# Patient Record
Sex: Female | Born: 1990 | Race: Black or African American | Hispanic: No | Marital: Single | Smoking: Current some day smoker
Health system: Southern US, Community
[De-identification: ages and names within clinical notes are randomized; demographics above are authoritative.]

## PROBLEM LIST (undated history)

## (undated) ENCOUNTER — Inpatient Hospital Stay (HOSPITAL_COMMUNITY): Payer: Self-pay

## (undated) DIAGNOSIS — B999 Unspecified infectious disease: Secondary | ICD-10-CM

## (undated) DIAGNOSIS — A4902 Methicillin resistant Staphylococcus aureus infection, unspecified site: Secondary | ICD-10-CM

## (undated) DIAGNOSIS — J45909 Unspecified asthma, uncomplicated: Secondary | ICD-10-CM

## (undated) DIAGNOSIS — R0600 Dyspnea, unspecified: Secondary | ICD-10-CM

## (undated) DIAGNOSIS — T4145XA Adverse effect of unspecified anesthetic, initial encounter: Secondary | ICD-10-CM

## (undated) DIAGNOSIS — O24419 Gestational diabetes mellitus in pregnancy, unspecified control: Secondary | ICD-10-CM

## (undated) DIAGNOSIS — D649 Anemia, unspecified: Secondary | ICD-10-CM

## (undated) DIAGNOSIS — T8859XA Other complications of anesthesia, initial encounter: Secondary | ICD-10-CM

## (undated) HISTORY — DX: Other complications of anesthesia, initial encounter: T88.59XA

## (undated) HISTORY — DX: Adverse effect of unspecified anesthetic, initial encounter: T41.45XA

---

## 2015-10-26 ENCOUNTER — Emergency Department (HOSPITAL_COMMUNITY)
Admission: EM | Admit: 2015-10-26 | Discharge: 2015-10-26 | Disposition: A | Discharge status: Home / Self Care | Payer: Self-pay | Attending: Emergency Medicine | Admitting: Emergency Medicine

## 2015-10-26 ENCOUNTER — Encounter (HOSPITAL_COMMUNITY): Payer: Self-pay | Admitting: Nurse Practitioner

## 2015-10-26 DIAGNOSIS — J45901 Unspecified asthma with (acute) exacerbation: Secondary | ICD-10-CM | POA: Insufficient documentation

## 2015-10-26 DIAGNOSIS — R062 Wheezing: Secondary | ICD-10-CM

## 2015-10-26 DIAGNOSIS — Z760 Encounter for issue of repeat prescription: Secondary | ICD-10-CM | POA: Insufficient documentation

## 2015-10-26 DIAGNOSIS — Z79899 Other long term (current) drug therapy: Secondary | ICD-10-CM | POA: Insufficient documentation

## 2015-10-26 DIAGNOSIS — R000 Tachycardia, unspecified: Secondary | ICD-10-CM | POA: Insufficient documentation

## 2015-10-26 HISTORY — DX: Unspecified asthma, uncomplicated: J45.909

## 2015-10-26 MED ORDER — IPRATROPIUM-ALBUTEROL 0.5-2.5 (3) MG/3ML IN SOLN
3.0000 mL | Freq: Once | RESPIRATORY_TRACT | Status: AC
  Administered 2015-10-26: 3 mL via RESPIRATORY_TRACT
  Filled 2015-10-26: qty 3

## 2015-10-26 MED ORDER — ALBUTEROL SULFATE (2.5 MG/3ML) 0.083% IN NEBU: 2.5000 mg | INHALATION_SOLUTION | Freq: Four times a day (QID) | RESPIRATORY_TRACT | Status: DC | PRN

## 2015-10-26 MED ORDER — ALBUTEROL SULFATE HFA 108 (90 BASE) MCG/ACT IN AERS: 1.0000 | INHALATION_SPRAY | Freq: Four times a day (QID) | RESPIRATORY_TRACT | Status: DC | PRN

## 2015-10-26 NOTE — ED Provider Notes (Signed)
CSN: [id number]     Arrival date & time 10/26/15  1246 History   First MD Initiated Contact with Patient 10/26/15 1327     Chief Complaint  Patient presents with  . Asthma     (Consider location/radiation/quality/duration/timing/severity/associated sxs/prior Treatment) Patient is a 25 y.o. female presenting with asthma. The history is provided by the patient. No language interpreter was used.  Asthma This is a new problem. The current episode started yesterday. The problem occurs intermittently.   Tina Grant is a.age female who present to the ED for refill of her asthma medications. She states that she is visiting and has been here for 2 weeks and ran out of her inhaler and neb medications 2 days ago. She has started having some wheezing and needs her medication. She denies any other problems.   Past Medical History  Diagnosis Date  . Asthma    Past Surgical History  Procedure Laterality Date  . Cesarean section     History reviewed. No pertinent family history. Social History  Substance Use Topics  . Smoking status: Never Smoker   . Smokeless tobacco: None  . Alcohol Use: No   OB History    No data available     Review of Systems Negative except as stated in HPI   Allergies  Shellfish allergy  Home Medications   Prior to Admission medications   Medication Sig Start Date End Date Taking? Authorizing Provider  albuterol (PROVENTIL HFA;VENTOLIN HFA) 108 (90 Base) MCG/ACT inhaler Inhale 1-2 puffs into the lungs every 6 (six) hours as needed for wheezing or shortness of breath. 10/26/15   Hope Orlene OchM Neese, NP  albuterol (PROVENTIL) (2.5 MG/3ML) 0.083% nebulizer solution Take 3 mLs (2.5 mg total) by nebulization every 6 (six) hours as needed for wheezing or shortness of breath. 10/26/15   Hope Orlene OchM Neese, NP   BP 105/84 mmHg  Pulse 102  Temp(Src) 98.5 F (36.9 C) (Oral)  Resp 18  Ht 5\' 6"  (1.676 m)  Wt 146.512 kg  BMI 52.16 kg/m2  SpO2 97% Physical Exam  Constitutional:  She is oriented to person, place, and time. She appears well-developed and well-nourished. No distress.  HENT:  Head: Normocephalic and atraumatic.  Right Ear: Tympanic membrane normal.  Left Ear: Tympanic membrane normal.  Nose: Nose normal.  Mouth/Throat: Uvula is midline, oropharynx is clear and moist and mucous membranes are normal.  Eyes: EOM are normal.  Neck: Neck supple.  Cardiovascular: Regular rhythm.  Tachycardia present.   Pulmonary/Chest: Effort normal. She has wheezes in the right upper field, the right middle field and the left upper field. She has no rhonchi. She has no rales.  Musculoskeletal: Normal range of motion.  Neurological: She is alert and oriented to person, place, and time. No cranial nerve deficit.  Skin: Skin is warm and dry.  Psychiatric: She has a normal mood and affect. Her behavior is normal.  Nursing note and vitals reviewed.   ED Course  Procedures   MDM  25 y.o. female with hx of asthma here today with wheezing and request for refill of medications. Stable for d/c with improvement after neb treatment and O2 SAT 97% on R/A. Will refill albuterol inhaler and albuterol neb medication. She will return as needed for worsening symptoms.   Final diagnoses:  Wheezing       Janne NapoleonHope M Neese, NP 10/26/15 1352  Vanetta MuldersScott Zackowski, MD 10/27/15 [phone]

## 2015-10-26 NOTE — ED Notes (Signed)
She c/o 1 day history asthma symptoms. She is visiting from out of town and ran out of her albuterol for her nebulizer. She denies fevers, cough, pain. She is wanting a refill before her asthma gets worse. She is alert and breathing easily

## 2016-06-30 ENCOUNTER — Emergency Department (HOSPITAL_COMMUNITY)
Admission: EM | Admit: 2016-06-30 | Discharge: 2016-06-30 | Disposition: A | Discharge status: Home / Self Care | Payer: Self-pay | Attending: Emergency Medicine | Admitting: Emergency Medicine

## 2016-06-30 ENCOUNTER — Encounter (HOSPITAL_COMMUNITY): Payer: Self-pay | Admitting: Emergency Medicine

## 2016-06-30 DIAGNOSIS — Z331 Pregnant state, incidental: Secondary | ICD-10-CM | POA: Insufficient documentation

## 2016-06-30 DIAGNOSIS — J45909 Unspecified asthma, uncomplicated: Secondary | ICD-10-CM | POA: Insufficient documentation

## 2016-06-30 DIAGNOSIS — R1030 Lower abdominal pain, unspecified: Secondary | ICD-10-CM | POA: Insufficient documentation

## 2016-06-30 DIAGNOSIS — Z3490 Encounter for supervision of normal pregnancy, unspecified, unspecified trimester: Secondary | ICD-10-CM

## 2016-06-30 LAB — POC URINE PREG, ED: Preg Test, Ur: POSITIVE — AB

## 2016-06-30 NOTE — Discharge Instructions (Signed)
You are approx. [redacted] weeks pregnant. You need to follow-up with an OB/GYN. I have given you two referrals. You needed to start taking prenatal vitamins. If you develop vaginal bleeding, vaginal discharge, worsening abdominal pain, fevers, urinary symptoms please return to the ED. You [facility] also [facility] with [facility] for any pregnancy emergencies.

## 2016-06-30 NOTE — ED Provider Notes (Signed)
[facility]-EMERGENCY DEPT Provider Note   CSN: [id number] Arrival date & time: 06/30/16  1712   By signing my name below, I, Tina Grant, attest that this documentation has been prepared under the direction and in the presence of non-physician practitioner, Rise MuKenneth T. Gwyndolyn Guilford, PA-C. Electronically Signed: Nelwyn SalisburyJoshua Grant, Scribe. 06/30/2016. 8:26 PM.   History   Chief Complaint Chief Complaint  Patient presents with  . Abdominal Pain   HPI  HPI Comments:  Tina Grant is a 25 y.o. female who presents to the Emergency Department complaining of sudden-onset intermittent currently resolved lower abd cramping beginning about a week ago. Pt describes her symptoms as a stabbing pain that feels similar to menstrual cramps she has had in the past, but she is not experiencing any other indications of menstruation. Patient was due for her period last week and is late. Pt has taken tylenol at home with relief. Denies any pain at this time. States there is a possibly pregnant. She denies any urgency, frequency, dysuria, nausea, vomiting, back pain, fever, vaginal bleeding or vaginal discharge. Her LNMP was November (05/16/16) and she states she is currently two weeks late.   Past Medical History:  Diagnosis Date  . Asthma     There are no active problems to display for this patient.   Past Surgical History:  Procedure Laterality Date  . CESAREAN SECTION      OB History    No data available       Home Medications    Prior to Admission medications   Medication Sig Start Date End Date Taking? Authorizing Provider  albuterol (PROVENTIL HFA;VENTOLIN HFA) 108 (90 Base) MCG/ACT inhaler Inhale 1-2 puffs into the lungs every 6 (six) hours as needed for wheezing or shortness of breath. 10/26/15   Hope Orlene OchM Neese, NP  albuterol (PROVENTIL) (2.5 MG/3ML) 0.083% nebulizer solution Take 3 mLs (2.5 mg total) by nebulization every 6 (six) hours as needed for wheezing or shortness of breath. 10/26/15   Hope Orlene OchM  Neese, NP    Family History No family history on file.  Social History Social History  Substance Use Topics  . Smoking status: Never Smoker  . Smokeless tobacco: Not on file  . Alcohol use No     Allergies   Shellfish allergy   Review of Systems Review of Systems  Constitutional: Negative for chills and fever.  Gastrointestinal: Positive for abdominal pain. Negative for nausea and vomiting.  Genitourinary: Positive for menstrual problem. Negative for dysuria, frequency, urgency, vaginal bleeding, vaginal discharge and vaginal pain.  All other systems reviewed and are negative.    Physical Exam Updated Vital Signs BP 107/70 (BP Location: Left Arm)   Pulse 99   Temp 99 F (37.2 C) (Oral)   Ht 5\' 6"  (1.676 m)   Wt (!) 348 lb (157.9 kg)   LMP 05/16/2016   SpO2 100%   BMI 56.17 kg/m   Physical Exam  Constitutional: She appears well-developed and well-nourished. No distress.  HENT:  Head: Normocephalic and atraumatic.  Mouth/Throat: Oropharynx is clear and moist.  Eyes: Conjunctivae are normal. Right eye exhibits no discharge. Left eye exhibits no discharge. No scleral icterus.  Neck: Normal range of motion. Neck supple. No thyromegaly present.  Cardiovascular: Normal rate, regular rhythm, normal heart sounds and intact distal pulses.   Pulmonary/Chest: Effort normal and breath sounds normal.  Abdominal: Soft. Bowel sounds are normal. She exhibits no distension. There is no tenderness. There is no rigidity, no rebound, no guarding and no CVA  tenderness.  Musculoskeletal: Normal range of motion.  Lymphadenopathy:    She has no cervical adenopathy.  Neurological: She is alert.  Skin: Skin is warm and dry. Capillary refill takes less than 2 seconds.  Vitals reviewed.    ED Treatments / Results  DIAGNOSTIC STUDIES:  Oxygen Saturation is 100% on RA, normal by my interpretation.    COORDINATION OF CARE:  8:31 PM Discussed treatment plan with pt at bedside which  includes details about her positive pregnancy test and referral to OB/GYN and pt agreed to plan.  Labs (all labs ordered are listed, but only abnormal results are displayed) Labs Reviewed  POC URINE PREG, ED - Abnormal; Notable for the following:       Result Value   Preg Test, Ur POSITIVE (*)    All other components within normal limits    EKG  EKG Interpretation None       Radiology No results found.  Procedures Procedures (including critical care time)  Medications Ordered in ED Medications - No data to display   Initial Impression / Assessment and Plan / ED Course  I have reviewed the triage vital signs and the nursing notes.  Pertinent labs & imaging results that were available during my care of the patient were reviewed by me and considered in my medical decision making (see chart for details).  Clinical Course   Patient presents to the ED today with lower abdominal cramping that started 1 week ago that has been intermittent. Has resolved at this time. Denies any pain today. Did experience some cramping yesterday. She denies any vaginal discharge or vaginal bleeding. States that her period was on 05/16/2016. She is currently 2 weeks late. States there is a possibility of her being pregnant. Pregnancy test was positive today. According to the pregnancy will she is approximately 6 weeks 4 days along. Abdominal exam is benign. Given the patient does not have any vaginal bleeding or vaginal discharge or pain at this time no further workup was indicated. Low suspicion for ectopic or miscarriage at this time. Pain is likely caused by implantation and changes for pregnancy. Patient is from out of town and has made her [place] here in [place]. She denies having any OB/GYN in the area. I have encouraged her to follow up with OB/GYN doctor. I given her referral to both the [facility] and the [facility]. Patient encouraged to start prenatal vitamins. Patient was discussed and seen  by Dr. Anitra LauthPlunkett who is agreeable to the above plan. Pt is hemodynamically stable, in NAD, & able to ambulate in the ED. Pt has no complaints prior to dc. Pt is comfortable with above plan and is stable for discharge at this time. All questions were answered prior to disposition. Strict return precautions for f/u to the ED were discussed including vaginal bleeding, vaginal discharge, worsening pain.  Final Clinical Impressions(s) / ED Diagnoses   Final diagnoses:  Pregnancy, unspecified gestational age    New Prescriptions Discharge Medication List as of 06/30/2016  8:59 PM    I personally performed the services described in this documentation, which was scribed in my presence. The recorded information has been reviewed and is accurate.     Rise MuKenneth T Graziella Connery, PA-C 07/01/16 [id number]    Gwyneth SproutWhitney Plunkett, MD 07/01/16 2138

## 2016-06-30 NOTE — ED Triage Notes (Signed)
Pt says she's had abdominal pain x 1 week but yesterday pain started increasing and it was a stabbing pain. LMP in early November

## 2016-06-30 NOTE — ED Notes (Signed)
Patient to wait in lobby. Patient instructed to let tech or RN know when she needs to pee so we can collect Urine specimen.

## 2016-07-27 ENCOUNTER — Encounter: Payer: Self-pay | Admitting: Certified Nurse Midwife

## 2016-07-27 ENCOUNTER — Ambulatory Visit (INDEPENDENT_AMBULATORY_CARE_PROVIDER_SITE_OTHER): Payer: BLUE CROSS/BLUE SHIELD | Admitting: Certified Nurse Midwife

## 2016-07-27 VITALS — BP 119/74 | HR 104 | Temp 98.6°F | Wt 358.7 lb

## 2016-07-27 DIAGNOSIS — N760 Acute vaginitis: Secondary | ICD-10-CM | POA: Diagnosis not present

## 2016-07-27 DIAGNOSIS — J4531 Mild persistent asthma with (acute) exacerbation: Secondary | ICD-10-CM

## 2016-07-27 DIAGNOSIS — Z3491 Encounter for supervision of normal pregnancy, unspecified, first trimester: Secondary | ICD-10-CM

## 2016-07-27 DIAGNOSIS — B9689 Other specified bacterial agents as the cause of diseases classified elsewhere: Secondary | ICD-10-CM

## 2016-07-27 DIAGNOSIS — Z113 Encounter for screening for infections with a predominantly sexual mode of transmission: Secondary | ICD-10-CM

## 2016-07-27 DIAGNOSIS — Z3490 Encounter for supervision of normal pregnancy, unspecified, unspecified trimester: Secondary | ICD-10-CM

## 2016-07-27 DIAGNOSIS — Z98891 History of uterine scar from previous surgery: Secondary | ICD-10-CM

## 2016-07-27 DIAGNOSIS — O0990 Supervision of high risk pregnancy, unspecified, unspecified trimester: Secondary | ICD-10-CM | POA: Insufficient documentation

## 2016-07-27 DIAGNOSIS — O99511 Diseases of the respiratory system complicating pregnancy, first trimester: Secondary | ICD-10-CM

## 2016-07-27 DIAGNOSIS — O99211 Obesity complicating pregnancy, first trimester: Secondary | ICD-10-CM

## 2016-07-27 DIAGNOSIS — O34219 Maternal care for unspecified type scar from previous cesarean delivery: Secondary | ICD-10-CM

## 2016-07-27 MED ORDER — ALBUTEROL SULFATE (2.5 MG/3ML) 0.083% IN NEBU: 2.5000 mg | INHALATION_SOLUTION | Freq: Four times a day (QID) | RESPIRATORY_TRACT | 12 refills | Status: DC | PRN

## 2016-07-27 MED ORDER — ALBUTEROL SULFATE HFA 108 (90 BASE) MCG/ACT IN AERS: 1.0000 | INHALATION_SPRAY | Freq: Four times a day (QID) | RESPIRATORY_TRACT | 0 refills | Status: DC | PRN

## 2016-07-27 NOTE — Addendum Note (Signed)
Addended by: Francene FindersJAMES, QUINETTA C on: 07/27/2016 12:52 PM   Modules accepted: Orders

## 2016-07-27 NOTE — Progress Notes (Signed)
Subjective:    Tina Grant is being seen today for her first obstetrical visit.  This is a planned pregnancy. She is at 6338w2d gestation. Her obstetrical history is significant for obesity and asthma. Relationship with FOB: significant other, living together. Patient does intend to breast feed. Pregnancy history fully reviewed.  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    The information documented in the HPI was reviewed and verified.  Menstrual History: OB History    Gravida Para Term Preterm AB Living   3 2       2    SAB TAB Ectopic Multiple Live Births           2      Obstetric Comments   NRFHR for 1st C-section 2nd C-section for repeat.       Patient's last menstrual period was 05/16/2016.    Past Medical History:  Diagnosis Date  . Asthma   . Complication of anesthesia     Past Surgical History:  Procedure Laterality Date  . CESAREAN SECTION    . CESAREAN SECTION N/A 02/11/2014     (Not in a hospital admission) Allergies  Allergen Reactions  . Shellfish Allergy     Social History  Substance Use Topics  . Smoking status: Never Smoker  . Smokeless tobacco: Never Used  . Alcohol use No    History reviewed. No pertinent family history.   Review of Systems Constitutional: negative for weight loss Gastrointestinal: negative for vomiting Genitourinary:negative for genital lesions and vaginal discharge and dysuria Musculoskeletal:negative for back pain Behavioral/Psych: negative for abusive relationship, depression, illegal drug usage and tobacco use    Objective:    BP 119/74   Pulse (!) 104   Temp 98.6 F (37 C)   Wt (!) 358 lb 11.2 oz (162.7 kg)   LMP 05/16/2016   BMI 57.90 kg/m  General Appearance:    Alert, cooperative, no distress, appears stated age  Head:    Normocephalic, without obvious abnormality, atraumatic  Eyes:    PERRL, conjunctiva/corneas clear, EOM's intact, fundi    benign, both eyes  Ears:    Normal TM's and external ear canals, both  ears  Nose:   Nares normal, septum midline, mucosa normal, no drainage    or sinus tenderness  Throat:   Lips, mucosa, and tongue normal; teeth and gums normal  Neck:   Supple, symmetrical, trachea midline, no adenopathy;    thyroid:  no enlargement/tenderness/nodules; no carotid   bruit or JVD  Back:     Symmetric, no curvature, ROM normal, no CVA tenderness  Lungs:     Clear to auscultation bilaterally, respirations unlabored  Chest Wall:    No tenderness or deformity   Heart:    Regular rate and rhythm, S1 and S2 normal, no murmur, rub   or gallop  Breast Exam:    No tenderness, masses, or nipple abnormality  Abdomen:     Soft, non-tender, bowel sounds active all four quadrants,    no masses, no organomegaly  Genitalia:    Normal female without lesion, discharge or tenderness  Extremities:   Extremities normal, atraumatic, no cyanosis or edema  Pulses:   2+ and symmetric all extremities  Skin:   Skin color, texture, turgor normal, no rashes or lesions  Lymph nodes:   Cervical, supraclavicular, and axillary nodes normal  Neurologic:   CNII-XII intact, normal strength, sensation and reflexes    throughout      Lab Review Urine pregnancy test Labs reviewed yes  Radiologic studies reviewed no Assessment:    Pregnancy at [redacted]w[redacted]d weeks   Morbid obesity  H/O C-section  Unsure of LMP  Plan:      Prenatal vitamins.  Counseling provided regarding continued use of seat belts, cessation of alcohol consumption, smoking or use of illicit drugs; infection precautions i.e., influenza/TDAP immunizations, toxoplasmosis,CMV, parvovirus, listeria and varicella; workplace safety, exercise during pregnancy; routine dental care, safe medications, sexual activity, hot tubs, saunas, pools, travel, caffeine use, fish and methlymercury, potential toxins, hair treatments, varicose veins Weight gain recommendations per IOM guidelines reviewed: underweight/BMI< 18.5--> gain 28 - 40 lbs; normal weight/BMI  18.5 - 24.9--> gain 25 - 35 lbs; overweight/BMI 25 - 29.9--> gain 15 - 25 lbs; obese/BMI >30->gain  11 - 20 lbs Problem list reviewed and updated. FIRST/CF mutation testing/NIPT/QUAD SCREEN/fragile X/Ashkenazi Jewish population testing/Spinal muscular atrophy discussed: requested. Role of ultrasound in pregnancy discussed; fetal survey: requested. Amniocentesis discussed: not indicated. VBAC calculator score: VBAC consent form provided Meds ordered this encounter  Medications  . albuterol (PROVENTIL) (2.5 MG/3ML) 0.083% nebulizer solution    Sig: Take 3 mLs (2.5 mg total) by nebulization every 6 (six) hours as needed for wheezing or shortness of breath.    Dispense:  75 mL    Refill:  12  . albuterol (PROVENTIL HFA;VENTOLIN HFA) 108 (90 Base) MCG/ACT inhaler    Sig: Inhale 1-2 puffs into the lungs every 6 (six) hours as needed for wheezing or shortness of breath.    Dispense:  1 Inhaler    Refill:  0   Orders Placed This Encounter  Procedures  . Culture, OB Urine  . US OB Transvaginal    Standing Status:   Future    Standing Expiration Date:   09/24/2017    Order Specific Question:   Reason for Exam (SYMPTOM  OR DIAGNOSIS REQUIRED)    Answer:   dating    Order Specific Question:   Preferred imaging location?    Answer:   [facility]  . US OB Comp Less 14 Wks    Standing Status:   Future    Standing Expiration Date:   09/24/2017    Order Specific Question:   Reason for Exam (SYMPTOM  OR DIAGNOSIS REQUIRED)    Answer:   dating    Order Specific Question:   Preferred imaging location?    Answer:   [facility]  . TSH  . Hemoglobinopathy evaluation  . Varicella zoster antibody, IgG  . ToxASSURE Select 13 (MW), Urine  . Hemoglobin A1c  . Obstetric Panel, Including HIV  . Cystic Fibrosis Mutation 97  . Beta HCG, Quant    Follow up in 4 weeks. 50% of 30 min visit spent on counseling and coordination of care.

## 2016-07-28 LAB — CERVICOVAGINAL ANCILLARY ONLY
BACTERIAL VAGINITIS: POSITIVE — AB
CANDIDA VAGINITIS: NEGATIVE
Chlamydia: NEGATIVE
Neisseria Gonorrhea: NEGATIVE
TRICH (WINDOWPATH): NEGATIVE

## 2016-07-29 ENCOUNTER — Ambulatory Visit (HOSPITAL_COMMUNITY)
Admission: RE | Admit: 2016-07-29 | Discharge: 2016-07-29 | Disposition: A | Discharge status: Home / Self Care | Payer: BLUE CROSS/BLUE SHIELD | Source: Ambulatory Visit | Attending: Certified Nurse Midwife | Admitting: Certified Nurse Midwife

## 2016-07-29 ENCOUNTER — Other Ambulatory Visit: Payer: Self-pay | Admitting: Certified Nurse Midwife

## 2016-07-29 DIAGNOSIS — Z3A11 11 weeks gestation of pregnancy: Secondary | ICD-10-CM | POA: Diagnosis not present

## 2016-07-29 DIAGNOSIS — O99211 Obesity complicating pregnancy, first trimester: Secondary | ICD-10-CM | POA: Diagnosis not present

## 2016-07-29 DIAGNOSIS — Z3490 Encounter for supervision of normal pregnancy, unspecified, unspecified trimester: Secondary | ICD-10-CM

## 2016-07-29 DIAGNOSIS — Z98891 History of uterine scar from previous surgery: Secondary | ICD-10-CM

## 2016-07-29 DIAGNOSIS — Z3491 Encounter for supervision of normal pregnancy, unspecified, first trimester: Secondary | ICD-10-CM | POA: Insufficient documentation

## 2016-07-29 DIAGNOSIS — B9689 Other specified bacterial agents as the cause of diseases classified elsewhere: Secondary | ICD-10-CM

## 2016-07-29 DIAGNOSIS — N760 Acute vaginitis: Principal | ICD-10-CM

## 2016-07-29 LAB — CYTOLOGY - PAP: Diagnosis: NEGATIVE

## 2016-07-29 MED ORDER — METRONIDAZOLE 0.75 % VA GEL: 1.0000 | Freq: Two times a day (BID) | VAGINAL | 0 refills | Status: DC

## 2016-07-30 ENCOUNTER — Other Ambulatory Visit: Payer: Self-pay | Admitting: Certified Nurse Midwife

## 2016-07-30 DIAGNOSIS — Z3480 Encounter for supervision of other normal pregnancy, unspecified trimester: Secondary | ICD-10-CM

## 2016-08-01 LAB — TOXASSURE SELECT 13 (MW), URINE

## 2016-08-02 LAB — CULTURE, OB URINE

## 2016-08-02 LAB — URINE CULTURE, OB REFLEX

## 2016-08-03 LAB — CYSTIC FIBROSIS MUTATION 97: Interpretation: NOT DETECTED

## 2016-08-03 LAB — OBSTETRIC PANEL, INCLUDING HIV
ANTIBODY SCREEN: NEGATIVE
BASOS: 0 %
Basophils Absolute: 0 10*3/uL (ref 0.0–0.2)
EOS (ABSOLUTE): 0.1 10*3/uL (ref 0.0–0.4)
EOS: 1 %
HEMATOCRIT: 33 % — AB (ref 34.0–46.6)
HEMOGLOBIN: 10.1 g/dL — AB (ref 11.1–15.9)
HIV Screen 4th Generation wRfx: NONREACTIVE
Hepatitis B Surface Ag: NEGATIVE
IMMATURE GRANS (ABS): 0 10*3/uL (ref 0.0–0.1)
Immature Granulocytes: 0 %
LYMPHS ABS: 2.5 10*3/uL (ref 0.7–3.1)
Lymphs: 28 %
MCH: 21.5 pg — AB (ref 26.6–33.0)
MCHC: 30.6 g/dL — AB (ref 31.5–35.7)
MCV: 70 fL — AB (ref 79–97)
MONOS ABS: 0.6 10*3/uL (ref 0.1–0.9)
Monocytes: 7 %
Neutrophils Absolute: 5.7 10*3/uL (ref 1.4–7.0)
Neutrophils: 64 %
Platelets: 352 10*3/uL (ref 150–379)
RBC: 4.7 x10E6/uL (ref 3.77–5.28)
RDW: 19.6 % — ABNORMAL HIGH (ref 12.3–15.4)
RH TYPE: POSITIVE
RPR Ser Ql: NONREACTIVE
Rubella Antibodies, IGG: 1.75 index (ref >0.99)
WBC: 9 10*3/uL (ref 3.4–10.8)

## 2016-08-03 LAB — HEMOGLOBINOPATHY EVALUATION
HEMOGLOBIN A2 QUANTITATION: 3.4 % — AB (ref 1.8–3.2)
HEMOGLOBIN F QUANTITATION: 0 % (ref 0.0–2.0)
HGB A: 62.3 % — AB (ref 96.4–98.8)
HGB C: 0 %
HGB S: 34.3 % — AB
HGB VARIANT: 0 %

## 2016-08-03 LAB — TSH: TSH: 1.55 u[IU]/mL (ref 0.450–4.500)

## 2016-08-03 LAB — HEMOGLOBIN A1C
Est. average glucose Bld gHb Est-mCnc: 128 mg/dL
Hgb A1c MFr Bld: 6.1 % — ABNORMAL HIGH (ref 4.8–5.6)

## 2016-08-03 LAB — BETA HCG QUANT (REF LAB): hCG Quant: 48039 m[IU]/mL

## 2016-08-03 LAB — VARICELLA ZOSTER ANTIBODY, IGG: Varicella zoster IgG: 461 index (ref >165)

## 2016-08-05 ENCOUNTER — Telehealth: Payer: Self-pay | Admitting: Family

## 2016-08-05 ENCOUNTER — Encounter: Payer: Self-pay | Admitting: Family

## 2016-08-05 DIAGNOSIS — N760 Acute vaginitis: Secondary | ICD-10-CM

## 2016-08-05 DIAGNOSIS — B9689 Other specified bacterial agents as the cause of diseases classified elsewhere: Secondary | ICD-10-CM | POA: Insufficient documentation

## 2016-08-05 DIAGNOSIS — O2341 Unspecified infection of urinary tract in pregnancy, first trimester: Secondary | ICD-10-CM

## 2016-08-05 DIAGNOSIS — R8271 Bacteriuria: Secondary | ICD-10-CM | POA: Insufficient documentation

## 2016-08-05 MED ORDER — AMOXICILLIN 500 MG PO CAPS
500.0000 mg | ORAL_CAPSULE | Freq: Three times a day (TID) | ORAL | 2 refills | Status: DC
Start: 2016-08-05 — End: 2016-10-21

## 2016-08-05 MED ORDER — METRONIDAZOLE 500 MG PO TABS: 500.0000 mg | ORAL_TABLET | Freq: Two times a day (BID) | ORAL | 0 refills | Status: DC

## 2016-08-05 NOTE — Telephone Encounter (Signed)
Left message that patient has results and need to call office.  When patient returns call inform regarding GBS in urine and bacterial vaginosis.  RX sent in for both.

## 2016-08-19 ENCOUNTER — Telehealth: Payer: Self-pay

## 2016-08-19 DIAGNOSIS — J4531 Mild persistent asthma with (acute) exacerbation: Secondary | ICD-10-CM

## 2016-08-19 MED ORDER — ALBUTEROL SULFATE HFA 108 (90 BASE) MCG/ACT IN AERS: 1.0000 | INHALATION_SPRAY | Freq: Four times a day (QID) | RESPIRATORY_TRACT | 0 refills | Status: DC | PRN

## 2016-08-19 MED ORDER — ALBUTEROL SULFATE (2.5 MG/3ML) 0.083% IN NEBU: 2.5000 mg | INHALATION_SOLUTION | Freq: Four times a day (QID) | RESPIRATORY_TRACT | 12 refills | Status: DC | PRN

## 2016-08-19 NOTE — Telephone Encounter (Signed)
Patient called in stating that the pharmacy never received her rx for her albulterol inhaler and nebulizer sent on 07-27-16, re-sent rx with provider approval.

## 2016-08-24 ENCOUNTER — Ambulatory Visit (INDEPENDENT_AMBULATORY_CARE_PROVIDER_SITE_OTHER): Payer: BLUE CROSS/BLUE SHIELD | Admitting: Certified Nurse Midwife

## 2016-08-24 VITALS — BP 131/82 | HR 87 | Wt 360.0 lb

## 2016-08-24 DIAGNOSIS — Z3480 Encounter for supervision of other normal pregnancy, unspecified trimester: Secondary | ICD-10-CM

## 2016-08-24 DIAGNOSIS — O99211 Obesity complicating pregnancy, first trimester: Secondary | ICD-10-CM

## 2016-08-24 NOTE — Progress Notes (Signed)
   PRENATAL VISIT NOTE  Subjective:  Tina Grant is a 26 y.o. G3P2 at 6648w2d being seen today for ongoing prenatal care.  She is currently monitored for the following issues for this low-risk pregnancy and has Supervision of normal pregnancy, antepartum; Morbid obesity (HCC); H/O: C-section; GBS bacteriuria; and Bacterial vaginosis on her problem list.  Patient reports no complaints.  Contractions: Not present. Vag. Bleeding: None.  Movement: Absent. Denies leaking of fluid.   The following portions of the patient's history were reviewed and updated as appropriate: allergies, current medications, past family history, past medical history, past social history, past surgical history and problem list. Problem list updated.  Objective:   Vitals:   08/24/16 0928  BP: 131/82  Pulse: 87  Weight: (!) 360 lb (163.3 kg)    Fetal Status: Fetal Heart Rate (bpm): 140   Movement: Absent     General:  Alert, oriented and cooperative. Patient is in no acute distress.  Skin: Skin is warm and dry. No rash noted.   Cardiovascular: Normal heart rate noted  Respiratory: Normal respiratory effort, no problems with respiration noted  Abdomen: Soft, gravid, appropriate for gestational age. Pain/Pressure: Absent     Pelvic:  Cervical exam deferred        Extremities: Normal range of motion.  Edema: Trace  Mental Status: Normal mood and affect. Normal behavior. Normal judgment and thought content.   Assessment and Plan:  Pregnancy: G3P2 at 9448w2d  1. Supervision of other normal pregnancy, antepartum      Declines Maternity 21 at this time.   - US MFM OB COMP + 14 WK; Future  2. Morbid obesity (HCC)     BMI 54.9  Preterm labor symptoms and general obstetric precautions including but not limited to vaginal bleeding, contractions, leaking of fluid and fetal movement were reviewed in detail with the patient. Please refer to After Visit Summary for other counseling recommendations.  Return in about 4 weeks  (around 09/21/2016) for ROB with AFP/Quad screen.   Roe Coombsachelle A Chanler Schreiter, CNM

## 2016-09-04 ENCOUNTER — Other Ambulatory Visit: Payer: Self-pay | Admitting: Certified Nurse Midwife

## 2016-09-04 DIAGNOSIS — J4531 Mild persistent asthma with (acute) exacerbation: Secondary | ICD-10-CM

## 2016-09-21 ENCOUNTER — Ambulatory Visit (INDEPENDENT_AMBULATORY_CARE_PROVIDER_SITE_OTHER): Payer: BLUE CROSS/BLUE SHIELD | Admitting: Certified Nurse Midwife

## 2016-09-21 ENCOUNTER — Ambulatory Visit (HOSPITAL_COMMUNITY)
Admission: RE | Admit: 2016-09-21 | Discharge: 2016-09-21 | Disposition: A | Discharge status: Home / Self Care | Payer: BLUE CROSS/BLUE SHIELD | Source: Ambulatory Visit | Attending: Certified Nurse Midwife | Admitting: Certified Nurse Midwife

## 2016-09-21 ENCOUNTER — Other Ambulatory Visit: Payer: Self-pay | Admitting: Certified Nurse Midwife

## 2016-09-21 ENCOUNTER — Encounter: Payer: Self-pay | Admitting: Certified Nurse Midwife

## 2016-09-21 ENCOUNTER — Ambulatory Visit (HOSPITAL_COMMUNITY): Payer: BLUE CROSS/BLUE SHIELD

## 2016-09-21 VITALS — BP 117/79 | HR 118 | Temp 98.1°F | Wt 370.0 lb

## 2016-09-21 DIAGNOSIS — Z3A18 18 weeks gestation of pregnancy: Secondary | ICD-10-CM | POA: Diagnosis not present

## 2016-09-21 DIAGNOSIS — O99212 Obesity complicating pregnancy, second trimester: Secondary | ICD-10-CM | POA: Diagnosis not present

## 2016-09-21 DIAGNOSIS — Z3482 Encounter for supervision of other normal pregnancy, second trimester: Secondary | ICD-10-CM

## 2016-09-21 DIAGNOSIS — Z3480 Encounter for supervision of other normal pregnancy, unspecified trimester: Secondary | ICD-10-CM

## 2016-09-21 DIAGNOSIS — Z3689 Encounter for other specified antenatal screening: Secondary | ICD-10-CM | POA: Diagnosis not present

## 2016-09-21 DIAGNOSIS — O34211 Maternal care for low transverse scar from previous cesarean delivery: Secondary | ICD-10-CM | POA: Diagnosis not present

## 2016-09-21 NOTE — Progress Notes (Signed)
Patient is in the office, denies pain/pressure. 

## 2016-09-21 NOTE — Progress Notes (Signed)
   PRENATAL VISIT NOTE  Subjective:  Tina Grant is a 26 y.o. G3P2 at 5873w2d being seen today for ongoing prenatal care.  She is currently monitored for the following issues for this low-risk pregnancy and has Supervision of normal pregnancy, antepartum; Morbid obesity (HCC); H/O: C-section; GBS bacteriuria; and Bacterial vaginosis on her problem list.  Patient reports no bleeding, no contractions, no cramping, no leaking and reports lower leg edema, encouraged no salt diet.  Contractions: Irritability. Vag. Bleeding: None.   . Denies leaking of fluid.   The following portions of the patient's history were reviewed and updated as appropriate: allergies, current medications, past family history, past medical history, past social history, past surgical history and problem list. Problem list updated.  Objective:   Vitals:   09/21/16 0855  BP: 117/79  Pulse: (!) 118  Temp: 98.1 F (36.7 C)  Weight: (!) 370 lb (167.8 kg)    Fetal Status: Fetal Heart Rate (bpm): 145         General:  Alert, oriented and cooperative. Patient is in no acute distress.  Skin: Skin is warm and dry. No rash noted.   Cardiovascular: Normal heart rate noted  Respiratory: Normal respiratory effort, no problems with respiration noted  Abdomen: Soft, gravid, appropriate for gestational age. Pain/Pressure: Absent     Pelvic:  Cervical exam deferred        Extremities: Normal range of motion.  Edema: Mild pitting, slight indentation  Mental Status: Normal mood and affect. Normal behavior. Normal judgment and thought content.   Assessment and Plan:  Pregnancy: G3P2 at 3273w2d  1. Supervision of other normal pregnancy, antepartum     Lower leg edema, normotensive.  Has anatomy scan scheduled for today.  - AFP, Quad Screen  Preterm labor symptoms and general obstetric precautions including but not limited to vaginal bleeding, contractions, leaking of fluid and fetal movement were reviewed in detail with the  patient. Please refer to After Visit Summary for other counseling recommendations.  Return in about 4 weeks (around 10/19/2016) for ROB.   Roe Coombsachelle A Edwardo Wojnarowski, CNM

## 2016-09-23 ENCOUNTER — Other Ambulatory Visit: Payer: Self-pay | Admitting: Certified Nurse Midwife

## 2016-09-23 DIAGNOSIS — Z3480 Encounter for supervision of other normal pregnancy, unspecified trimester: Secondary | ICD-10-CM

## 2016-09-29 ENCOUNTER — Other Ambulatory Visit: Payer: Self-pay | Admitting: Obstetrics

## 2016-09-29 DIAGNOSIS — J4531 Mild persistent asthma with (acute) exacerbation: Secondary | ICD-10-CM

## 2016-09-30 ENCOUNTER — Other Ambulatory Visit: Payer: Self-pay | Admitting: Certified Nurse Midwife

## 2016-09-30 DIAGNOSIS — Z3480 Encounter for supervision of other normal pregnancy, unspecified trimester: Secondary | ICD-10-CM

## 2016-09-30 LAB — AFP, QUAD SCREEN
DIA Mom Value: 0.9
DIA VALUE (EIA): 102.07 pg/mL
DSR (By Age)    1 IN: 827
DSR (SECOND TRIMESTER) 1 IN: 3072
GESTATIONAL AGE AFP: 18.2 wk
MATERNAL AGE AT EDD: 28.4 a
MSAFP Mom: 0.97
MSAFP: 26.7 ng/mL
MSHCG Mom: 1.01
MSHCG: 16520 m[IU]/mL
OSB RISK: 10000
TEST RESULTS AFP: NEGATIVE
UE3 MOM: 0.75
WEIGHT: 370 [lb_av]
uE3 Value: 0.86 ng/mL

## 2016-10-15 ENCOUNTER — Other Ambulatory Visit: Payer: Self-pay | Admitting: Obstetrics

## 2016-10-15 DIAGNOSIS — J4531 Mild persistent asthma with (acute) exacerbation: Secondary | ICD-10-CM

## 2016-10-19 ENCOUNTER — Ambulatory Visit (INDEPENDENT_AMBULATORY_CARE_PROVIDER_SITE_OTHER): Payer: BLUE CROSS/BLUE SHIELD | Admitting: Certified Nurse Midwife

## 2016-10-19 ENCOUNTER — Encounter: Payer: Self-pay | Admitting: Certified Nurse Midwife

## 2016-10-19 VITALS — BP 111/76 | HR 109 | Wt 365.0 lb

## 2016-10-19 DIAGNOSIS — Z3480 Encounter for supervision of other normal pregnancy, unspecified trimester: Secondary | ICD-10-CM

## 2016-10-19 DIAGNOSIS — Z98891 History of uterine scar from previous surgery: Secondary | ICD-10-CM

## 2016-10-19 DIAGNOSIS — Z3482 Encounter for supervision of other normal pregnancy, second trimester: Secondary | ICD-10-CM

## 2016-10-19 DIAGNOSIS — R8271 Bacteriuria: Secondary | ICD-10-CM

## 2016-10-19 NOTE — Progress Notes (Signed)
   PRENATAL VISIT NOTE  Subjective:  Tina Grant is a 26 y.o. G3P2 at [redacted]w[redacted]d being seen today for ongoing prenatal care.  She is currently monitored for the following issues for this low-risk pregnancy and has Supervision of normal pregnancy, antepartum; Morbid obesity (HCC); H/O: C-section; and GBS bacteriuria on her problem list.  Patient reports backache, no bleeding, no contractions, no cramping, no leaking and lower leg edema.  Contractions: Regular. Vag. Bleeding: None.  Movement: Present. Denies leaking of fluid.   The following portions of the patient's history were reviewed and updated as appropriate: allergies, current medications, past family history, past medical history, past social history, past surgical history and problem list. Problem list updated.  Objective:   Vitals:   10/19/16 0902  BP: 111/76  Pulse: (!) 109  Weight: (!) 365 lb (165.6 kg)    Fetal Status: Fetal Heart Rate (bpm): 142 Fundal Height: 27 cm Movement: Present     General:  Alert, oriented and cooperative. Patient is in no acute distress.  Skin: Skin is warm and dry. No rash noted.   Cardiovascular: Normal heart rate noted  Respiratory: Normal respiratory effort, no problems with respiration noted  Abdomen: Soft, gravid, appropriate for gestational age. Pain/Pressure: Present     Pelvic:  Cervical exam deferred        Extremities: Normal range of motion.  Edema: Trace  Mental Status: Normal mood and affect. Normal behavior. Normal judgment and thought content.   Assessment and Plan:  Pregnancy: G3P2 at [redacted]w[redacted]d  1. Supervision of other normal pregnancy, antepartum     Doing well.   2. Morbid obesity (HCC)     27 lbs weight gain this pregnancy  3. H/O: C-section     Desires repeat C-section  4. GBS bacteriuria     PCN for labor/delivery if before C-section  Preterm labor symptoms and general obstetric precautions including but not limited to vaginal bleeding, contractions, leaking of fluid and  fetal movement were reviewed in detail with the patient. Please refer to After Visit Summary for other counseling recommendations.  Return in about 4 weeks (around 11/16/2016) for ROB.   Roe Coombs, CNM

## 2016-10-20 ENCOUNTER — Inpatient Hospital Stay (HOSPITAL_COMMUNITY)
Admission: AD | Admit: 2016-10-20 | Discharge: 2016-10-21 | Disposition: A | Discharge status: Home / Self Care | Payer: BLUE CROSS/BLUE SHIELD | Source: Ambulatory Visit | Attending: Obstetrics & Gynecology | Admitting: Obstetrics & Gynecology

## 2016-10-20 ENCOUNTER — Encounter (HOSPITAL_COMMUNITY): Payer: Self-pay

## 2016-10-20 DIAGNOSIS — Z3492 Encounter for supervision of normal pregnancy, unspecified, second trimester: Secondary | ICD-10-CM

## 2016-10-20 DIAGNOSIS — O99519 Diseases of the respiratory system complicating pregnancy, unspecified trimester: Secondary | ICD-10-CM

## 2016-10-20 DIAGNOSIS — Z3A22 22 weeks gestation of pregnancy: Secondary | ICD-10-CM | POA: Insufficient documentation

## 2016-10-20 DIAGNOSIS — J45909 Unspecified asthma, uncomplicated: Secondary | ICD-10-CM

## 2016-10-20 DIAGNOSIS — O99512 Diseases of the respiratory system complicating pregnancy, second trimester: Secondary | ICD-10-CM | POA: Insufficient documentation

## 2016-10-20 NOTE — MAU Note (Signed)
Pt here with c/o trouble breathing after use of inhaler and nebulizer; hx of asthma.

## 2016-10-21 DIAGNOSIS — J45909 Unspecified asthma, uncomplicated: Secondary | ICD-10-CM

## 2016-10-21 DIAGNOSIS — O99512 Diseases of the respiratory system complicating pregnancy, second trimester: Secondary | ICD-10-CM | POA: Diagnosis not present

## 2016-10-21 DIAGNOSIS — Z3A22 22 weeks gestation of pregnancy: Secondary | ICD-10-CM | POA: Diagnosis not present

## 2016-10-21 DIAGNOSIS — O99519 Diseases of the respiratory system complicating pregnancy, unspecified trimester: Secondary | ICD-10-CM | POA: Diagnosis not present

## 2016-10-21 MED ORDER — GI COCKTAIL ~~LOC~~
30.0000 mL | Freq: Once | ORAL | Status: AC
  Administered 2016-10-21: 30 mL via ORAL
  Filled 2016-10-21: qty 30

## 2016-10-21 MED ORDER — PREDNISONE 50 MG PO TABS
50.0000 mg | ORAL_TABLET | Freq: Every day | ORAL | 0 refills | Status: DC
Start: 2016-10-21 — End: 2016-12-15

## 2016-10-21 MED ORDER — IPRATROPIUM-ALBUTEROL 0.5-2.5 (3) MG/3ML IN SOLN
3.0000 mL | Freq: Once | RESPIRATORY_TRACT | Status: AC
  Administered 2016-10-21: 3 mL via RESPIRATORY_TRACT
  Filled 2016-10-21: qty 3

## 2016-10-21 MED ORDER — PREDNISONE 50 MG PO TABS
60.0000 mg | ORAL_TABLET | Freq: Once | ORAL | Status: AC
  Administered 2016-10-21: 60 mg via ORAL
  Filled 2016-10-21 (×2): qty 1

## 2016-10-21 MED ORDER — MONTELUKAST SODIUM 10 MG PO TABS
10.0000 mg | ORAL_TABLET | Freq: Every day | ORAL | 0 refills | Status: DC
Start: 2016-10-21 — End: 2016-12-15

## 2016-10-21 NOTE — MAU Provider Note (Signed)
History     CSN: [id number]  Arrival date and time: 10/20/16 2327  First Provider Initiated Contact with Patient 10/21/16 0009      Chief Complaint  Patient presents with  . Asthma   Tina Grant is a 26 y.o. G3P2 at [redacted]w[redacted]d who presents for SOB & chest tightness r/t asthma. States normally has asthma attack with weather changes. Doesn't have PCP locally who manages her asthma. Used albuterol nebulizer earlier today without relief. Was hospitalized for asthma attack last year. Doesn't smoke. Denies ob/gyn complaints.    Asthma  She complains of chest tightness, cough, shortness of breath and wheezing. There is no difficulty breathing, frequent throat clearing, hoarse voice or sputum production. This is a chronic problem. The current episode started today. Episode frequency: "with weather changes" The cough is non-productive. Associated symptoms include heartburn. Pertinent negatives include no chest pain, ear pain, fever, headaches, nasal congestion, rhinorrhea, sneezing, sore throat or trouble swallowing. Her symptoms are aggravated by change in weather. Her symptoms are not alleviated by beta-agonist. Her past medical history is significant for asthma.    OB History    Gravida Para Term Preterm AB Living   SAB TAB Ectopic Multiple Live Births           2      Obstetric Comments   NRFHR for 1st C-section 2nd C-section for repeat.      Past Medical History:  Diagnosis Date  . Asthma   . Complication of anesthesia     Past Surgical History:  Procedure Laterality Date  . CESAREAN SECTION    . CESAREAN SECTION N/A 02/11/2014    No family history on file.  Social History  Substance Use Topics  . Smoking status: Never Smoker  . Smokeless tobacco: Never Used  . Alcohol use No    Allergies:  Allergies  Allergen Reactions  . Shellfish Allergy     Prescriptions Prior to Admission  Medication Sig Dispense Refill Last Dose  . albuterol (PROVENTIL) (2.5  MG/3ML) 0.083% nebulizer solution   11   . amoxicillin (AMOXIL) 500 MG capsule Take 1 capsule (500 mg total) by mouth 3 (three) times daily. 21 capsule 2 Taking  . doxylamine, Sleep, (UNISOM) 25 MG tablet Take 25 mg by mouth at bedtime as needed.   Taking  . Prenatal Vit-Fe Fumarate-FA (MULTIVITAMIN-PRENATAL) 27-0.8 MG TABS tablet Take 1 tablet by mouth daily at 12 noon.   Taking  . PROAIR HFA 108 (90 Base) MCG/ACT inhaler   0     Review of Systems  Constitutional: Negative.  Negative for fever.  HENT: Negative.  Negative for ear pain, hoarse voice, rhinorrhea, sneezing, sore throat and trouble swallowing.   Respiratory: Positive for cough, chest tightness, shortness of breath and wheezing. Negative for sputum production and choking.   Cardiovascular: Negative for chest pain and palpitations.  Gastrointestinal: Positive for heartburn. Negative for abdominal pain, nausea and vomiting.  Genitourinary: Negative.   Neurological: Negative for headaches.   Physical Exam   Blood pressure 139/79, pulse (!) 105, temperature 97.9 F (36.6 C), temperature source Oral, resp. rate 19, height  (1.676 m), weight (!) 369 lb (167.4 kg), last menstrual period 05/16/2016, SpO2 98 %, unknown if currently breastfeeding.  Temp:  [97.9 F (36.65409811917.9 F (36.6 C) (04/18 2339) Pulse Rate:  [103-118] 105 (04/19 0124) Resp:  [19-20] 19 (04/19 0124) BP: (137-139)/(79-84) 139/79 (04/19 0124) SpO2:  [96 %-  100 %] 98 % (04/19 0102) Weight:  [369 lb (167.4 kg)] 369 lb (167.4 kg) (04/18 2339)  Physical Exam  Nursing note and vitals reviewed. Constitutional: She is oriented to person, place, and time. She appears well-developed and well-nourished. No distress.  HENT:  Head: Normocephalic and atraumatic.  Eyes: Conjunctivae are normal. Right eye exhibits no discharge. Left eye exhibits no discharge. No scleral icterus.  Neck: Normal range of motion.  Cardiovascular: Normal rate, regular rhythm and normal  heart sounds.   No murmur heard. Respiratory: Effort normal. No respiratory distress. She has decreased breath sounds in the right middle field, the right lower field, the left middle field and the left lower field. She has wheezes in the right upper field.  Neurological: She is alert and oriented to person, place, and time.  Skin: Skin is warm and dry. She is not diaphoretic.  Psychiatric: She has a normal mood and affect. Her behavior is normal. Judgment and thought content normal.    MAU Course  Procedures No results found for this or any previous visit (from the past 24 hour(s)).  MDM FHT 152 SpO2 96-100% Duoneb tx Prednisone 60 mg PO Pt reports improvement in symptoms Breath sounds clear bilaterally s/p breathing tx Assessment and Plan  A; 1. Asthma affecting pregnancy, antepartum   2. Fetal heart tones present, second trimester    P: Discharge home Rx singulair & prednisone Continue albuterol prn Discussed reasons to return to MAU Keep follow up appointment with OB/PCP   Judeth Horn 10/21/2016, 12:09 AM

## 2016-10-21 NOTE — Discharge Instructions (Signed)

## 2016-11-07 ENCOUNTER — Other Ambulatory Visit: Payer: Self-pay | Admitting: Obstetrics

## 2016-11-07 DIAGNOSIS — J4531 Mild persistent asthma with (acute) exacerbation: Secondary | ICD-10-CM

## 2016-11-09 ENCOUNTER — Ambulatory Visit (HOSPITAL_COMMUNITY)
Admission: RE | Admit: 2016-11-09 | Discharge: 2016-11-09 | Disposition: A | Discharge status: Home / Self Care | Payer: BLUE CROSS/BLUE SHIELD | Source: Ambulatory Visit | Attending: Certified Nurse Midwife | Admitting: Certified Nurse Midwife

## 2016-11-09 ENCOUNTER — Other Ambulatory Visit: Payer: Self-pay | Admitting: Certified Nurse Midwife

## 2016-11-09 DIAGNOSIS — O99212 Obesity complicating pregnancy, second trimester: Secondary | ICD-10-CM | POA: Insufficient documentation

## 2016-11-09 DIAGNOSIS — Z362 Encounter for other antenatal screening follow-up: Secondary | ICD-10-CM | POA: Diagnosis present

## 2016-11-09 DIAGNOSIS — Z3A25 25 weeks gestation of pregnancy: Secondary | ICD-10-CM | POA: Diagnosis not present

## 2016-11-09 DIAGNOSIS — O34219 Maternal care for unspecified type scar from previous cesarean delivery: Secondary | ICD-10-CM | POA: Diagnosis not present

## 2016-11-09 DIAGNOSIS — Z98891 History of uterine scar from previous surgery: Secondary | ICD-10-CM

## 2016-11-09 DIAGNOSIS — Z048 Encounter for examination and observation for other specified reasons: Secondary | ICD-10-CM | POA: Insufficient documentation

## 2016-11-09 DIAGNOSIS — IMO0002 Reserved for concepts with insufficient information to code with codable children: Secondary | ICD-10-CM

## 2016-11-09 DIAGNOSIS — Z3480 Encounter for supervision of other normal pregnancy, unspecified trimester: Secondary | ICD-10-CM

## 2016-11-09 DIAGNOSIS — Z0489 Encounter for examination and observation for other specified reasons: Secondary | ICD-10-CM

## 2016-11-14 ENCOUNTER — Other Ambulatory Visit: Payer: Self-pay | Admitting: Certified Nurse Midwife

## 2016-11-14 DIAGNOSIS — Z3480 Encounter for supervision of other normal pregnancy, unspecified trimester: Secondary | ICD-10-CM

## 2016-11-16 ENCOUNTER — Ambulatory Visit (INDEPENDENT_AMBULATORY_CARE_PROVIDER_SITE_OTHER): Payer: BLUE CROSS/BLUE SHIELD | Admitting: Certified Nurse Midwife

## 2016-11-16 VITALS — BP 118/75 | HR 118 | Wt 365.1 lb

## 2016-11-16 DIAGNOSIS — O34219 Maternal care for unspecified type scar from previous cesarean delivery: Secondary | ICD-10-CM

## 2016-11-16 DIAGNOSIS — O99212 Obesity complicating pregnancy, second trimester: Secondary | ICD-10-CM

## 2016-11-16 DIAGNOSIS — Z98891 History of uterine scar from previous surgery: Secondary | ICD-10-CM

## 2016-11-16 DIAGNOSIS — Z3482 Encounter for supervision of other normal pregnancy, second trimester: Secondary | ICD-10-CM

## 2016-11-16 DIAGNOSIS — Z3480 Encounter for supervision of other normal pregnancy, unspecified trimester: Secondary | ICD-10-CM

## 2016-11-16 NOTE — Progress Notes (Signed)
   PRENATAL VISIT NOTE  Subjective:  Tina Grant is a 26 y.o. G3P2 at 664w2d being seen today for ongoing prenatal care.  She is currently monitored for the following issues for this low-risk pregnancy and has Supervision of normal pregnancy, antepartum; Morbid obesity (HCC); H/O: C-section; and GBS bacteriuria on her problem list.  Patient reports braxton hicks contractions..  Contractions: Irritability. Vag. Bleeding: None.  Movement: Present. Denies leaking of fluid.   The following portions of the patient's history were reviewed and updated as appropriate: allergies, current medications, past family history, past medical history, past social history, past surgical history and problem list. Problem list updated.  Objective:   Vitals:   11/16/16 0914  BP: 118/75  Pulse: (!) 118  Weight: (!) 365 lb 1.6 oz (165.6 kg)    Fetal Status: Fetal Heart Rate (bpm): 150 Fundal Height: 30 cm Movement: Present     General:  Alert, oriented and cooperative. Patient is in no acute distress.  Skin: Skin is warm and dry. No rash noted.   Cardiovascular: Normal heart rate noted  Respiratory: Normal respiratory effort, no problems with respiration noted  Abdomen: Soft, gravid, appropriate for gestational age. Pain/Pressure: Absent     Pelvic:  Cervical exam deferred        Extremities: Normal range of motion.  Edema: Mild pitting, slight indentation  Mental Status: Normal mood and affect. Normal behavior. Normal judgment and thought content.   Assessment and Plan:  Pregnancy: G3P2 at 5464w2d  1. Supervision of other normal pregnancy, antepartum  Reports doing well,occasional braxton hicks contractions, denies any other symptoms, Encouraged adequate water intake, Anatomy scan d/t previous limited view - US MFM OB FOLLOW UP; Future  2. H/O: C-section Desires repeat c-section  3. Morbid obesity (HCC) 25 lbs weight gain this pregnancy.  Preterm labor symptoms and general obstetric precautions  including but not limited to vaginal bleeding, contractions, leaking of fluid and fetal movement were reviewed in detail with the patient. Please refer to After Visit Summary for other counseling recommendations.  Return in about 2 weeks (around 11/30/2016) for ROB, 2 hr OGTT.   Roe Coombsenney, Rachelle A, CNM

## 2016-11-16 NOTE — Progress Notes (Signed)
Patient reports good fetal movement and ocassional uterine irritability.

## 2016-12-01 ENCOUNTER — Ambulatory Visit (INDEPENDENT_AMBULATORY_CARE_PROVIDER_SITE_OTHER): Payer: BLUE CROSS/BLUE SHIELD | Admitting: Certified Nurse Midwife

## 2016-12-01 ENCOUNTER — Encounter: Payer: Self-pay | Admitting: Certified Nurse Midwife

## 2016-12-01 ENCOUNTER — Other Ambulatory Visit: Payer: BLUE CROSS/BLUE SHIELD

## 2016-12-01 VITALS — BP 113/75 | HR 112 | Wt 364.0 lb

## 2016-12-01 DIAGNOSIS — Z3483 Encounter for supervision of other normal pregnancy, third trimester: Secondary | ICD-10-CM

## 2016-12-01 DIAGNOSIS — Z8709 Personal history of other diseases of the respiratory system: Secondary | ICD-10-CM

## 2016-12-01 DIAGNOSIS — Z3480 Encounter for supervision of other normal pregnancy, unspecified trimester: Secondary | ICD-10-CM

## 2016-12-01 DIAGNOSIS — Z98891 History of uterine scar from previous surgery: Secondary | ICD-10-CM

## 2016-12-01 DIAGNOSIS — R8271 Bacteriuria: Secondary | ICD-10-CM

## 2016-12-01 MED ORDER — PROAIR HFA 108 (90 BASE) MCG/ACT IN AERS: 2.0000 | INHALATION_SPRAY | RESPIRATORY_TRACT | 99 refills | Status: DC | PRN

## 2016-12-01 NOTE — Progress Notes (Signed)
   PRENATAL VISIT NOTE  Subjective:  Tina Grant is a 26 y.o. G3P2 at 1016w3d being seen today for ongoing prenatal care.  She is currently monitored for the following issues for this low-risk pregnancy and has Supervision of normal pregnancy, antepartum; Morbid obesity (HCC); H/O: C-section; GBS bacteriuria; and History of asthma on her problem list.  Patient reports no complaints.  Contractions: Not present. Vag. Bleeding: None.  Movement: Present. Denies leaking of fluid.   The following portions of the patient's history were reviewed and updated as appropriate: allergies, current medications, past family history, past medical history, past social history, past surgical history and problem list. Problem list updated.  Objective:   Vitals:   12/01/16 1101  BP: 113/75  Pulse: (!) 112  Weight: (!) 364 lb (165.1 kg)    Fetal Status: Fetal Heart Rate (bpm): 155 Fundal Height: 34 cm Movement: Present     General:  Alert, oriented and cooperative. Patient is in no acute distress.  Skin: Skin is warm and dry. No rash noted.   Cardiovascular: Normal heart rate noted  Respiratory: Normal respiratory effort, no problems with respiration noted  Abdomen: Soft, gravid, appropriate for gestational age. Pain/Pressure: Absent     Pelvic:  Cervical exam deferred        Extremities: Normal range of motion.     Mental Status: Normal mood and affect. Normal behavior. Normal judgment and thought content.   Assessment and Plan:  Pregnancy: G3P2 at 7016w3d  1. Supervision of other normal pregnancy, antepartum  - Glucose Tolerance, 2 Hours w/1 Hour - CBC - HIV antibody - RPR  2. Morbid obesity (HCC) 24lbs weight gain this pregnancy.  3. H/O: C-section Desires repeat c-section.  4. GBS bacteriuria   Preterm labor symptoms and general obstetric precautions including but not limited to vaginal bleeding, contractions, leaking of fluid and fetal movement were reviewed in detail with the  patient. Please refer to After Visit Summary for other counseling recommendations.  Return in about 2 weeks (around 12/15/2016) for ROB.   Roe Coombsachelle A Denney, CNM

## 2016-12-02 ENCOUNTER — Other Ambulatory Visit: Payer: Self-pay | Admitting: Certified Nurse Midwife

## 2016-12-02 DIAGNOSIS — O24419 Gestational diabetes mellitus in pregnancy, unspecified control: Secondary | ICD-10-CM

## 2016-12-02 DIAGNOSIS — O0990 Supervision of high risk pregnancy, unspecified, unspecified trimester: Secondary | ICD-10-CM

## 2016-12-02 DIAGNOSIS — D509 Iron deficiency anemia, unspecified: Secondary | ICD-10-CM | POA: Insufficient documentation

## 2016-12-02 DIAGNOSIS — O99013 Anemia complicating pregnancy, third trimester: Secondary | ICD-10-CM | POA: Insufficient documentation

## 2016-12-02 LAB — GLUCOSE TOLERANCE, 2 HOURS W/ 1HR
GLUCOSE, 1 HOUR: 183 mg/dL — AB (ref 65–179)
GLUCOSE, FASTING: 96 mg/dL — AB (ref 65–91)
Glucose, 2 hour: 157 mg/dL — ABNORMAL HIGH (ref 65–152)

## 2016-12-02 LAB — CBC
Hematocrit: 30.5 % — ABNORMAL LOW (ref 34.0–46.6)
Hemoglobin: 9.7 g/dL — ABNORMAL LOW (ref 11.1–15.9)
MCH: 22.2 pg — AB (ref 26.6–33.0)
MCHC: 31.8 g/dL (ref 31.5–35.7)
MCV: 70 fL — ABNORMAL LOW (ref 79–97)
PLATELETS: 257 10*3/uL (ref 150–379)
RBC: 4.36 x10E6/uL (ref 3.77–5.28)
RDW: 21.1 % — AB (ref 12.3–15.4)
WBC: 7.8 10*3/uL (ref 3.4–10.8)

## 2016-12-02 LAB — RPR: RPR: NONREACTIVE

## 2016-12-02 LAB — HIV ANTIBODY (ROUTINE TESTING W REFLEX): HIV SCREEN 4TH GENERATION: NONREACTIVE

## 2016-12-02 MED ORDER — CITRANATAL BLOOM 90-1 MG PO TABS: 1.0000 | ORAL_TABLET | Freq: Every day | ORAL | 12 refills | Status: DC

## 2016-12-06 ENCOUNTER — Other Ambulatory Visit: Payer: Self-pay | Admitting: *Deleted

## 2016-12-06 ENCOUNTER — Other Ambulatory Visit: Payer: Self-pay | Admitting: Certified Nurse Midwife

## 2016-12-06 DIAGNOSIS — D509 Iron deficiency anemia, unspecified: Secondary | ICD-10-CM

## 2016-12-06 DIAGNOSIS — O99013 Anemia complicating pregnancy, third trimester: Principal | ICD-10-CM

## 2016-12-06 MED ORDER — FUSION PLUS PO CAPS: 1.0000 | ORAL_CAPSULE | Freq: Two times a day (BID) | ORAL | 5 refills | Status: DC

## 2016-12-08 ENCOUNTER — Other Ambulatory Visit: Payer: Self-pay | Admitting: *Deleted

## 2016-12-08 DIAGNOSIS — O24410 Gestational diabetes mellitus in pregnancy, diet controlled: Secondary | ICD-10-CM

## 2016-12-08 MED ORDER — BLOOD GLUCOSE MONITORING SUPPL DEVI: 99 refills | Status: DC

## 2016-12-08 NOTE — Progress Notes (Signed)
See lab

## 2016-12-15 ENCOUNTER — Other Ambulatory Visit: Payer: Self-pay | Admitting: Certified Nurse Midwife

## 2016-12-15 ENCOUNTER — Encounter: Payer: Self-pay | Admitting: Certified Nurse Midwife

## 2016-12-15 ENCOUNTER — Ambulatory Visit (INDEPENDENT_AMBULATORY_CARE_PROVIDER_SITE_OTHER): Payer: BLUE CROSS/BLUE SHIELD | Admitting: Certified Nurse Midwife

## 2016-12-15 ENCOUNTER — Ambulatory Visit (HOSPITAL_COMMUNITY)
Admission: RE | Admit: 2016-12-15 | Discharge: 2016-12-15 | Disposition: A | Discharge status: Home / Self Care | Payer: BLUE CROSS/BLUE SHIELD | Source: Ambulatory Visit | Attending: Certified Nurse Midwife | Admitting: Certified Nurse Midwife

## 2016-12-15 ENCOUNTER — Encounter (HOSPITAL_COMMUNITY): Payer: Self-pay

## 2016-12-15 VITALS — BP 136/85 | HR 114 | Wt 371.0 lb

## 2016-12-15 DIAGNOSIS — O24410 Gestational diabetes mellitus in pregnancy, diet controlled: Secondary | ICD-10-CM

## 2016-12-15 DIAGNOSIS — O99210 Obesity complicating pregnancy, unspecified trimester: Secondary | ICD-10-CM | POA: Insufficient documentation

## 2016-12-15 DIAGNOSIS — O99513 Diseases of the respiratory system complicating pregnancy, third trimester: Secondary | ICD-10-CM | POA: Insufficient documentation

## 2016-12-15 DIAGNOSIS — Z362 Encounter for other antenatal screening follow-up: Secondary | ICD-10-CM | POA: Insufficient documentation

## 2016-12-15 DIAGNOSIS — Z0489 Encounter for examination and observation for other specified reasons: Secondary | ICD-10-CM

## 2016-12-15 DIAGNOSIS — O99213 Obesity complicating pregnancy, third trimester: Secondary | ICD-10-CM

## 2016-12-15 DIAGNOSIS — J45909 Unspecified asthma, uncomplicated: Secondary | ICD-10-CM | POA: Insufficient documentation

## 2016-12-15 DIAGNOSIS — D509 Iron deficiency anemia, unspecified: Secondary | ICD-10-CM

## 2016-12-15 DIAGNOSIS — Z3480 Encounter for supervision of other normal pregnancy, unspecified trimester: Secondary | ICD-10-CM

## 2016-12-15 DIAGNOSIS — O99519 Diseases of the respiratory system complicating pregnancy, unspecified trimester: Secondary | ICD-10-CM

## 2016-12-15 DIAGNOSIS — O0990 Supervision of high risk pregnancy, unspecified, unspecified trimester: Secondary | ICD-10-CM

## 2016-12-15 DIAGNOSIS — IMO0002 Reserved for concepts with insufficient information to code with codable children: Secondary | ICD-10-CM

## 2016-12-15 DIAGNOSIS — O99013 Anemia complicating pregnancy, third trimester: Secondary | ICD-10-CM

## 2016-12-15 DIAGNOSIS — O0993 Supervision of high risk pregnancy, unspecified, third trimester: Secondary | ICD-10-CM

## 2016-12-15 DIAGNOSIS — Z3A30 30 weeks gestation of pregnancy: Secondary | ICD-10-CM | POA: Insufficient documentation

## 2016-12-15 DIAGNOSIS — R8271 Bacteriuria: Secondary | ICD-10-CM

## 2016-12-15 DIAGNOSIS — Z98891 History of uterine scar from previous surgery: Secondary | ICD-10-CM

## 2016-12-15 NOTE — Progress Notes (Signed)
   PRENATAL VISIT NOTE  Subjective:  Tina Grant is a 26 y.o. G3P2 at [redacted]w[redacted]d being seen today for ongoing prenatal care.  She is currently monitored for the following issues for this high-risk pregnancy and has Supervision of high risk pregnancy, antepartum; Morbid obesity (HCC); H/O: C-section; GBS bacteriuria; History of asthma; Gestational diabetes mellitus (GDM) in third trimester; and Maternal iron deficiency anemia affecting pregnancy, antepartum, third trimester on her problem list.  Patient reports no complaints.  Contractions: Irritability. Vag. Bleeding: None.  Movement: Present. Denies leaking of fluid.   The following portions of the patient's history were reviewed and updated as appropriate: allergies, current medications, past family history, past medical history, past social history, past surgical history and problem list. Problem list updated.  Objective:   Vitals:   12/15/16 1321  BP: 136/85  Pulse: (!) 114  Weight: (!) 371 lb (168.3 kg)    Fetal Status: Fetal Heart Rate (bpm): 150 Fundal Height: 43 cm Movement: Present     General:  Alert, oriented and cooperative. Patient is in no acute distress.  Skin: Skin is warm and dry. No rash noted.   Cardiovascular: Normal heart rate noted  Respiratory: Normal respiratory effort, no problems with respiration noted  Abdomen: Soft, gravid, appropriate for gestational age. Pain/Pressure: Present     Pelvic:  Cervical exam deferred        Extremities: Normal range of motion.  Edema: Trace  Mental Status: Normal mood and affect. Normal behavior. Normal judgment and thought content.   Assessment and Plan:  Pregnancy: G3P2 at [redacted]w[redacted]d  1. Supervision of high risk pregnancy, antepartum     - US MFM OB FOLLOW UP; Future  2. Morbid obesity (HCC)     31 lb weight gain this pregnancy  3. H/O: C-section     Repeat C-section scheduled for 02/13/17  4. GBS bacteriuria      If labor PCN  5. Maternal iron deficiency anemia affecting  pregnancy, antepartum, third trimester     Taking Bloom  6. Diet controlled gestational diabetes mellitus (GDM) in third trimester     Has DM teaching class on 12/22/16.  Will pick up supplies after she gets paid this week.    Preterm labor symptoms and general obstetric precautions including but not limited to vaginal bleeding, contractions, leaking of fluid and fetal movement were reviewed in detail with the patient. Please refer to After Visit Summary for other counseling recommendations.  Return in about 2 weeks (around 12/29/2016) [facility], Needs to see FP MD here.   Roe Coombsachelle A Ocie Tino, CNM

## 2016-12-22 ENCOUNTER — Encounter: Payer: BLUE CROSS/BLUE SHIELD | Attending: Certified Nurse Midwife | Admitting: Registered"

## 2016-12-22 ENCOUNTER — Telehealth: Payer: Self-pay

## 2016-12-22 DIAGNOSIS — Z713 Dietary counseling and surveillance: Secondary | ICD-10-CM | POA: Diagnosis not present

## 2016-12-22 DIAGNOSIS — O24419 Gestational diabetes mellitus in pregnancy, unspecified control: Secondary | ICD-10-CM | POA: Insufficient documentation

## 2016-12-22 DIAGNOSIS — Z3A00 Weeks of gestation of pregnancy not specified: Secondary | ICD-10-CM | POA: Diagnosis not present

## 2016-12-22 NOTE — Telephone Encounter (Signed)
Patient called in and stated the problem was with her pharmacy and not us, but the problem has been fixed.

## 2016-12-22 NOTE — Telephone Encounter (Signed)
Returned call, left vm.

## 2016-12-24 ENCOUNTER — Encounter: Payer: Self-pay | Admitting: Registered"

## 2016-12-24 NOTE — Progress Notes (Signed)
Patient was seen on 12/22/16 for Gestational Diabetes self-management class at the [facility]. The following learning objectives were met by the patient during this course:   States the definition of Gestational Diabetes  States why dietary management is important in controlling blood glucose  Describes the effects each nutrient has on blood glucose levels  Demonstrates ability to create a balanced meal plan  Demonstrates carbohydrate counting   States when to check blood glucose levels  Demonstrates proper blood glucose monitoring techniques  States the effect of stress and exercise on blood glucose levels  States the importance of limiting caffeine and abstaining from alcohol and smoking  Blood glucose monitor given: none given Lot # n/a Exp: n/a Blood glucose reading: n/a (pt had meter in class, but pt pharmacy provided wrong meter & incompatible strips)  Patient instructed to monitor glucose levels: FBS: 60 - <90 1 hour: <140 2 hour: <120  Patient received handouts:  Nutrition Diabetes and Pregnancy  Carbohydrate Counting List  Patient will be seen for follow-up as needed.

## 2016-12-27 ENCOUNTER — Other Ambulatory Visit: Payer: Self-pay | Admitting: Certified Nurse Midwife

## 2016-12-27 DIAGNOSIS — O24410 Gestational diabetes mellitus in pregnancy, diet controlled: Secondary | ICD-10-CM

## 2016-12-28 ENCOUNTER — Ambulatory Visit (INDEPENDENT_AMBULATORY_CARE_PROVIDER_SITE_OTHER): Payer: BLUE CROSS/BLUE SHIELD | Admitting: Obstetrics and Gynecology

## 2016-12-28 VITALS — BP 127/81 | HR 116 | Wt 368.0 lb

## 2016-12-28 DIAGNOSIS — O0993 Supervision of high risk pregnancy, unspecified, third trimester: Secondary | ICD-10-CM

## 2016-12-28 DIAGNOSIS — R8271 Bacteriuria: Secondary | ICD-10-CM

## 2016-12-28 DIAGNOSIS — O0990 Supervision of high risk pregnancy, unspecified, unspecified trimester: Secondary | ICD-10-CM

## 2016-12-28 DIAGNOSIS — O24410 Gestational diabetes mellitus in pregnancy, diet controlled: Secondary | ICD-10-CM

## 2016-12-28 DIAGNOSIS — Z98891 History of uterine scar from previous surgery: Secondary | ICD-10-CM

## 2016-12-28 DIAGNOSIS — O34219 Maternal care for unspecified type scar from previous cesarean delivery: Secondary | ICD-10-CM

## 2016-12-28 NOTE — Progress Notes (Signed)
Patient reports good fetal movement and contractions that come and go. 

## 2016-12-28 NOTE — Patient Instructions (Signed)

## 2016-12-28 NOTE — Progress Notes (Signed)
Subjective:  Tina Grant is a 26 y.o. G3P2 at 7135w2d being seen today for ongoing prenatal care.  She is currently monitored for the following issues for this high-risk pregnancy and has Supervision of high risk pregnancy, antepartum; Morbid obesity (HCC); H/O: C-section; GBS bacteriuria; History of asthma; Gestational diabetes mellitus (GDM) in third trimester; and Maternal iron deficiency anemia affecting pregnancy, antepartum, third trimester on her problem list.  Patient reports no complaints.  Contractions: Irregular. Vag. Bleeding: None.  Movement: Present. Denies leaking of fluid.   The following portions of the patient's history were reviewed and updated as appropriate: allergies, current medications, past family history, past medical history, past social history, past surgical history and problem list. Problem list updated.  Objective:   Vitals:   12/28/16 1110  BP: 127/81  Pulse: (!) 116  Weight: (!) 368 lb (166.9 kg)    Fetal Status: Fetal Heart Rate (bpm): 143   Movement: Present     General:  Alert, oriented and cooperative. Patient is in no acute distress.  Skin: Skin is warm and dry. No rash noted.   Cardiovascular: Normal heart rate noted  Respiratory: Normal respiratory effort, no problems with respiration noted  Abdomen: Soft, gravid, appropriate for gestational age. Pain/Pressure: Absent     Pelvic:  Cervical exam deferred        Extremities: Normal range of motion.  Edema: Trace  Mental Status: Normal mood and affect. Normal behavior. Normal judgment and thought content.   Urinalysis:      Assessment and Plan:  Pregnancy: G3P2 at 435w2d  1. Supervision of high risk pregnancy, antepartum Stable  2. Diet controlled gestational diabetes mellitus (GDM) in third trimester Has seen Diabetic educator Just started monitoring BS due to problems obtaining monitor. This has been corrected. Fasting BS are 100-110's, PP in goal range Diet reviewed with pt GDM and pregnancy  relationship reviewed Pt instructed to call office with BS readings next week. 3. H/O: C-section For repeat 02/13/17  4. GBS bacteriuria   Preterm labor symptoms and general obstetric precautions including but not limited to vaginal bleeding, contractions, leaking of fluid and fetal movement were reviewed in detail with the patient. Please refer to After Visit Summary for other counseling recommendations.  Return in about 2 weeks (around 01/11/2017) for OB visit.   Hermina StaggersErvin, Tayvon Culley L, MD

## 2017-01-12 ENCOUNTER — Ambulatory Visit (INDEPENDENT_AMBULATORY_CARE_PROVIDER_SITE_OTHER): Payer: BLUE CROSS/BLUE SHIELD | Admitting: Obstetrics & Gynecology

## 2017-01-12 VITALS — BP 122/9 | HR 117 | Wt 369.0 lb

## 2017-01-12 DIAGNOSIS — O24410 Gestational diabetes mellitus in pregnancy, diet controlled: Secondary | ICD-10-CM

## 2017-01-12 DIAGNOSIS — O0990 Supervision of high risk pregnancy, unspecified, unspecified trimester: Secondary | ICD-10-CM

## 2017-01-12 DIAGNOSIS — Z8709 Personal history of other diseases of the respiratory system: Secondary | ICD-10-CM

## 2017-01-12 DIAGNOSIS — O0993 Supervision of high risk pregnancy, unspecified, third trimester: Secondary | ICD-10-CM

## 2017-01-12 MED ORDER — PROAIR HFA 108 (90 BASE) MCG/ACT IN AERS: 2.0000 | INHALATION_SPRAY | RESPIRATORY_TRACT | 99 refills | Status: DC | PRN

## 2017-01-12 MED ORDER — GLYBURIDE 2.5 MG PO TABS: 2.5000 mg | ORAL_TABLET | Freq: Every day | ORAL | 1 refills | Status: DC

## 2017-01-12 MED ORDER — GLUCOSE BLOOD VI STRP: ORAL_STRIP | 12 refills | Status: DC

## 2017-01-12 NOTE — Patient Instructions (Signed)

## 2017-01-12 NOTE — Progress Notes (Signed)
   PRENATAL VISIT NOTE  Subjective:  Tina Grant is a 26 y.o. G3P2 at 5628w3d being seen today for ongoing prenatal care.  She is currently monitored for the following issues for this high-risk pregnancy and has Supervision of high risk pregnancy, antepartum; Morbid obesity (HCC); H/O: C-section; GBS bacteriuria; History of asthma; Gestational diabetes mellitus (GDM) in third trimester; and Maternal iron deficiency anemia affecting pregnancy, antepartum, third trimester on her problem list.  Patient reports no complaints.  Contractions: Irregular. Vag. Bleeding: None.  Movement: Present. Denies leaking of fluid.   The following portions of the patient's history were reviewed and updated as appropriate: allergies, current medications, past family history, past medical history, past social history, past surgical history and problem list. Problem list updated.  Objective:   Vitals:   01/12/17 1427  BP: (!) 122/9  Pulse: (!) 117  Weight: (!) 369 lb (167.4 kg)    Fetal Status: Fetal Heart Rate (bpm): 152   Movement: Present     General:  Alert, oriented and cooperative. Patient is in no acute distress.  Skin: Skin is warm and dry. No rash noted.   Cardiovascular: Normal heart rate noted  Respiratory: Normal respiratory effort, no problems with respiration noted  Abdomen: Soft, gravid, appropriate for gestational age. Pain/Pressure: Present     Pelvic:  Cervical exam deferred        Extremities: Normal range of motion.  Edema: Trace  Mental Status: Normal mood and affect. Normal behavior. Normal judgment and thought content.   Assessment and Plan:  Pregnancy: G3P2 at 7528w3d  1. Supervision of high risk pregnancy, antepartum FBS 100-109, will add glyburide - glucose blood (ACCU-CHEK GUIDE) test strip; Check blood glucose levels four times daily  Dispense: 100 each; Refill: 12 - US MFM FETAL BPP WO NON STRESS; Future  2. gestational diabetes mellitus (GDM) in third trimester  - glyBURIDE  (DIABETA) 2.5 MG tablet; Take 1 tablet (2.5 mg total) by mouth at bedtime.  Dispense: 30 tablet; Refill: 1  3. History of asthma refill - PROAIR HFA 108 (90 Base) MCG/ACT inhaler; Inhale 2 puffs into the lungs every 4 (four) hours as needed for wheezing or shortness of breath.  Dispense: 18 g; Refill: PRN  Preterm labor symptoms and general obstetric precautions including but not limited to vaginal bleeding, contractions, leaking of fluid and fetal movement were reviewed in detail with the patient. Please refer to After Visit Summary for other counseling recommendations.  Return in about 1 week (around 01/19/2017). BPP and f/u [place] in 2 days  Scheryl DarterJames Arnold, MD

## 2017-01-12 NOTE — Progress Notes (Signed)
Pt requests rf on test strips and Proair.

## 2017-01-14 ENCOUNTER — Ambulatory Visit (HOSPITAL_COMMUNITY)
Admission: RE | Admit: 2017-01-14 | Discharge: 2017-01-14 | Disposition: A | Discharge status: Home / Self Care | Payer: BLUE CROSS/BLUE SHIELD | Source: Ambulatory Visit | Attending: Certified Nurse Midwife | Admitting: Certified Nurse Midwife

## 2017-01-14 DIAGNOSIS — O99210 Obesity complicating pregnancy, unspecified trimester: Secondary | ICD-10-CM | POA: Diagnosis not present

## 2017-01-14 DIAGNOSIS — Z3A34 34 weeks gestation of pregnancy: Secondary | ICD-10-CM | POA: Diagnosis not present

## 2017-01-14 DIAGNOSIS — O34211 Maternal care for low transverse scar from previous cesarean delivery: Secondary | ICD-10-CM | POA: Diagnosis not present

## 2017-01-14 DIAGNOSIS — J45909 Unspecified asthma, uncomplicated: Secondary | ICD-10-CM | POA: Insufficient documentation

## 2017-01-14 DIAGNOSIS — O24410 Gestational diabetes mellitus in pregnancy, diet controlled: Secondary | ICD-10-CM | POA: Diagnosis not present

## 2017-01-14 DIAGNOSIS — O99513 Diseases of the respiratory system complicating pregnancy, third trimester: Secondary | ICD-10-CM | POA: Diagnosis not present

## 2017-01-14 DIAGNOSIS — O0990 Supervision of high risk pregnancy, unspecified, unspecified trimester: Secondary | ICD-10-CM

## 2017-01-14 DIAGNOSIS — Z362 Encounter for other antenatal screening follow-up: Secondary | ICD-10-CM | POA: Diagnosis not present

## 2017-01-17 ENCOUNTER — Other Ambulatory Visit (HOSPITAL_COMMUNITY)
Admission: RE | Admit: 2017-01-17 | Discharge: 2017-01-17 | Disposition: A | Discharge status: Home / Self Care | Payer: BLUE CROSS/BLUE SHIELD | Source: Ambulatory Visit | Attending: Certified Nurse Midwife | Admitting: Certified Nurse Midwife

## 2017-01-17 ENCOUNTER — Ambulatory Visit (INDEPENDENT_AMBULATORY_CARE_PROVIDER_SITE_OTHER): Payer: BLUE CROSS/BLUE SHIELD | Admitting: Certified Nurse Midwife

## 2017-01-17 VITALS — BP 110/79 | HR 123 | Wt 367.8 lb

## 2017-01-17 DIAGNOSIS — O0990 Supervision of high risk pregnancy, unspecified, unspecified trimester: Secondary | ICD-10-CM | POA: Diagnosis present

## 2017-01-17 DIAGNOSIS — O0993 Supervision of high risk pregnancy, unspecified, third trimester: Secondary | ICD-10-CM

## 2017-01-17 DIAGNOSIS — O24415 Gestational diabetes mellitus in pregnancy, controlled by oral hypoglycemic drugs: Secondary | ICD-10-CM

## 2017-01-17 DIAGNOSIS — R8271 Bacteriuria: Secondary | ICD-10-CM

## 2017-01-17 NOTE — Progress Notes (Signed)
Subjective:  Tina Grant is a 26 y.o. G3P2 at 2485w1d being seen today for ongoing prenatal care.  She is currently monitored for the following issues for this high-risk pregnancy and has Supervision of high risk pregnancy, antepartum; Morbid obesity (HCC); H/O: C-section; GBS bacteriuria; History of asthma; Gestational diabetes mellitus (GDM) in third trimester; and Maternal iron deficiency anemia affecting pregnancy, antepartum, third trimester on her problem list.  Patient reports no complaints.  Contractions: Not present. Vag. Bleeding: None.  Movement: Present. Denies leaking of fluid.   The following portions of the patient's history were reviewed and updated as appropriate: allergies, current medications, past family history, past medical history, past social history, past surgical history and problem list. Problem list updated.  Objective:   Vitals:   01/17/17 1117  BP: 110/79  Pulse: (!) 123  Weight: (!) 367 lb 12.8 oz (166.8 kg)    Fetal Status: Fetal Heart Rate (bpm): 150   Movement: Present     General:  Alert, oriented and cooperative. Patient is in no acute distress.  Skin: Skin is warm and dry. No rash noted.   Cardiovascular: Normal heart rate noted  Respiratory: Normal respiratory effort, no problems with respiration noted  Abdomen: Soft, gravid, appropriate for gestational age. Pain/Pressure: Absent     Pelvic: Vag. Bleeding: None     Cervical exam deferred        Extremities: Normal range of motion.  Edema: Mild pitting, slight indentation  Mental Status: Normal mood and affect. Normal behavior. Normal judgment and thought content.   Urinalysis:      Assessment and Plan:  Pregnancy: G3P2 at 3285w1d  1. Supervision of high risk pregnancy, antepartum - GC Probe amplification, urine  2. Gestational diabetes mellitus (GDM) in third trimester controlled on oral hypoglycemic drug - 34 wks: EFW 5-10, 65 %ile, AFV 16cm, BPP 8/8 - start twice weekly AT - started Glyburide  5 days ago, FBS 77-89, postprandials 90-120 (2 other values 133, 138)  3. Previous CS - repeat scheduled  Preterm labor symptoms and general obstetric precautions including but not limited to vaginal bleeding, contractions, leaking of fluid and fetal movement were reviewed in detail with the patient. Please refer to After Visit Summary for other counseling recommendations.  Return in about 1 week (around 01/24/2017).   Donette LarryBhambri, Takelia Urieta, CNM

## 2017-01-17 NOTE — Progress Notes (Signed)
Patient reports good fetal movement, denies pain. 

## 2017-01-17 NOTE — Addendum Note (Signed)
Addended by: Natale MilchSTALLING, BRITTANY D on: 01/17/2017 03:54 PM   Modules accepted: Orders

## 2017-01-18 LAB — GC/CHLAMYDIA PROBE AMP (~~LOC~~) NOT AT ARMC
CHLAMYDIA, DNA PROBE: NEGATIVE
Neisseria Gonorrhea: NEGATIVE

## 2017-01-25 ENCOUNTER — Encounter: Payer: Self-pay | Admitting: Obstetrics and Gynecology

## 2017-01-25 ENCOUNTER — Encounter: Payer: Self-pay | Admitting: *Deleted

## 2017-01-25 ENCOUNTER — Other Ambulatory Visit: Payer: Self-pay | Admitting: Certified Nurse Midwife

## 2017-01-25 ENCOUNTER — Other Ambulatory Visit: Payer: Self-pay | Admitting: Obstetrics and Gynecology

## 2017-01-25 ENCOUNTER — Ambulatory Visit (INDEPENDENT_AMBULATORY_CARE_PROVIDER_SITE_OTHER): Payer: BLUE CROSS/BLUE SHIELD | Admitting: Obstetrics and Gynecology

## 2017-01-25 ENCOUNTER — Encounter: Payer: BLUE CROSS/BLUE SHIELD | Admitting: Obstetrics and Gynecology

## 2017-01-25 ENCOUNTER — Other Ambulatory Visit (HOSPITAL_COMMUNITY)
Admission: RE | Admit: 2017-01-25 | Discharge: 2017-01-25 | Disposition: A | Discharge status: Home / Self Care | Payer: BLUE CROSS/BLUE SHIELD | Source: Ambulatory Visit | Attending: Obstetrics and Gynecology | Admitting: Obstetrics and Gynecology

## 2017-01-25 ENCOUNTER — Ambulatory Visit: Payer: BLUE CROSS/BLUE SHIELD

## 2017-01-25 VITALS — BP 115/58 | HR 148 | Wt 364.1 lb

## 2017-01-25 DIAGNOSIS — R8271 Bacteriuria: Secondary | ICD-10-CM

## 2017-01-25 DIAGNOSIS — O24415 Gestational diabetes mellitus in pregnancy, controlled by oral hypoglycemic drugs: Secondary | ICD-10-CM

## 2017-01-25 DIAGNOSIS — O0990 Supervision of high risk pregnancy, unspecified, unspecified trimester: Secondary | ICD-10-CM

## 2017-01-25 DIAGNOSIS — O0993 Supervision of high risk pregnancy, unspecified, third trimester: Secondary | ICD-10-CM | POA: Diagnosis not present

## 2017-01-25 DIAGNOSIS — Z98891 History of uterine scar from previous surgery: Secondary | ICD-10-CM

## 2017-01-25 DIAGNOSIS — Z3A36 36 weeks gestation of pregnancy: Secondary | ICD-10-CM | POA: Diagnosis not present

## 2017-01-25 DIAGNOSIS — O24410 Gestational diabetes mellitus in pregnancy, diet controlled: Secondary | ICD-10-CM

## 2017-01-25 NOTE — Addendum Note (Signed)
Addended by: Dalphine HandingGARDNER, Destynee Stringfellow L on: 01/25/2017 12:00 PM   Modules accepted: Orders

## 2017-01-25 NOTE — Progress Notes (Signed)
   PRENATAL VISIT NOTE  Subjective:  Tina Grant is a 26 y.o. G3P2 at 393w2d being seen today for ongoing prenatal care.  She is currently monitored for the following issues for this high-risk pregnancy and has Supervision of high risk pregnancy, antepartum; Morbid obesity (HCC); H/O: C-section; GBS bacteriuria; History of asthma; Gestational diabetes mellitus (GDM) in third trimester; and Maternal iron deficiency anemia affecting pregnancy, antepartum, third trimester on her problem list.  Patient reports no complaints.  Contractions: Irregular. Vag. Bleeding: None.  Movement: Present. Denies leaking of fluid.   The following portions of the patient's history were reviewed and updated as appropriate: allergies, current medications, past family history, past medical history, past social history, past surgical history and problem list. Problem list updated.  Objective:   Vitals:   01/25/17 0908  Weight: (!) 364 lb 1.6 oz (165.2 kg)    Fetal Status:     Movement: Present     General:  Alert, oriented and cooperative. Patient is in no acute distress.  Skin: Skin is warm and dry. No rash noted.   Cardiovascular: Normal heart rate noted  Respiratory: Normal respiratory effort, no problems with respiration noted  Abdomen: Soft, gravid, appropriate for gestational age.  Pain/Pressure: Present     Pelvic: Cervical exam deferred        Extremities: Normal range of motion.  Edema: Trace  Mental Status:  Normal mood and affect. Normal behavior. Normal judgment and thought content.   Assessment and Plan:  Pregnancy: G3P2 at 513w2d  1. GBS bacteriuria Will provide prophylaxis if labor  2. Supervision of high risk pregnancy, antepartum Patient is doing well without complaints Scheduled for repeat c-section on 8/12 Plans for IUD for contraception GC/Cl culture today 3. Gestational diabetes mellitus (GDM) in third trimester controlled on oral hypoglycemic drug CBGs reviewed and all within  range Continue glyburide 2.5 mg qHS Normal growth ultrasound on 7/13 - NST reviewed and reactive with baseline 130, mod variability, +accels, no decels  - Pt informed that the ultrasound is considered a limited OB ultrasound and is not intended to be a complete ultrasound exam.  Patient also informed that the ultrasound is not being completed with the intent of assessing for fetal or placental anomalies or any pelvic abnormalities.  Explained that the purpose of today's ultrasound is to assess for  presentation and AFI with fetus in vertex position and AFI 6.94.  Patient acknowledges the purpose of the exam and the limitations of the study.     4. H/O: C-section Scheduled for repeat   5. Morbid obesity (HCC)   Preterm labor symptoms and general obstetric precautions including but not limited to vaginal bleeding, contractions, leaking of fluid and fetal movement were reviewed in detail with the patient. Please refer to After Visit Summary for other counseling recommendations.  No Follow-up on file.   Catalina AntiguaPeggy Shevawn Langenberg, MD

## 2017-01-26 LAB — URINE CYTOLOGY ANCILLARY ONLY
CHLAMYDIA, DNA PROBE: NEGATIVE
NEISSERIA GONORRHEA: NEGATIVE

## 2017-01-28 ENCOUNTER — Ambulatory Visit (INDEPENDENT_AMBULATORY_CARE_PROVIDER_SITE_OTHER): Payer: BLUE CROSS/BLUE SHIELD

## 2017-01-28 ENCOUNTER — Inpatient Hospital Stay (HOSPITAL_COMMUNITY)
Admission: AD | Admit: 2017-01-28 | Discharge: 2017-01-28 | Disposition: A | Discharge status: Home / Self Care | Payer: BLUE CROSS/BLUE SHIELD | Source: Ambulatory Visit | Attending: Obstetrics and Gynecology | Admitting: Obstetrics and Gynecology

## 2017-01-28 ENCOUNTER — Encounter (HOSPITAL_COMMUNITY): Payer: Self-pay | Admitting: *Deleted

## 2017-01-28 ENCOUNTER — Inpatient Hospital Stay (HOSPITAL_COMMUNITY): Payer: BLUE CROSS/BLUE SHIELD

## 2017-01-28 DIAGNOSIS — Z87891 Personal history of nicotine dependence: Secondary | ICD-10-CM | POA: Insufficient documentation

## 2017-01-28 DIAGNOSIS — O24415 Gestational diabetes mellitus in pregnancy, controlled by oral hypoglycemic drugs: Secondary | ICD-10-CM | POA: Diagnosis not present

## 2017-01-28 DIAGNOSIS — Z91013 Allergy to seafood: Secondary | ICD-10-CM | POA: Diagnosis not present

## 2017-01-28 DIAGNOSIS — O288 Other abnormal findings on antenatal screening of mother: Secondary | ICD-10-CM | POA: Insufficient documentation

## 2017-01-28 DIAGNOSIS — Z3689 Encounter for other specified antenatal screening: Secondary | ICD-10-CM

## 2017-01-28 DIAGNOSIS — Z3A36 36 weeks gestation of pregnancy: Secondary | ICD-10-CM | POA: Insufficient documentation

## 2017-01-28 DIAGNOSIS — J45909 Unspecified asthma, uncomplicated: Secondary | ICD-10-CM | POA: Insufficient documentation

## 2017-01-28 DIAGNOSIS — O99513 Diseases of the respiratory system complicating pregnancy, third trimester: Secondary | ICD-10-CM | POA: Diagnosis not present

## 2017-01-28 DIAGNOSIS — Z79899 Other long term (current) drug therapy: Secondary | ICD-10-CM | POA: Diagnosis not present

## 2017-01-28 HISTORY — DX: Gestational diabetes mellitus in pregnancy, unspecified control: O24.419

## 2017-01-28 HISTORY — DX: Unspecified infectious disease: B99.9

## 2017-01-28 HISTORY — DX: Methicillin resistant Staphylococcus aureus infection, unspecified site: A49.02

## 2017-01-28 HISTORY — DX: Anemia, unspecified: D64.9

## 2017-01-28 NOTE — Progress Notes (Signed)
NST: no accels, no decels, minimal variability, Cat. 2 tracing. No contractions on toco.  Sent for BPP.   R.Dequavius Kuhner CNM

## 2017-01-28 NOTE — Progress Notes (Signed)
Pt here for Nurse Visit NST. DX Gestational DM. Non reactive NST.

## 2017-01-28 NOTE — Progress Notes (Addendum)
Assumed care of patient. Pt laying flat on back. Tilted to left side. Picking up MH rate as pt being turned. See strip. Pt is G3P2 @ 36.[redacted] wksga with hx two c/s.  [facility]. Denies LOF bleeding but Ctx since past two days and today stronger q5 mins 5/10 pain level and tolerable. Palpate moderated.   1117: provider at bs. POC discussed. BPP ordered.  To U/S taken by NurseTech  1247: checked on pt. Monitors placed. Noted ctx and pt feeling it stronger now. Will con to monitor.   1430: discharge instructions given with pt understanding.  1437: Pt left unit via ambulatory.

## 2017-01-28 NOTE — MAU Provider Note (Signed)
History     CSN: [id number]  Arrival date and time: 01/28/17 1019  First Provider Initiated Contact with Patient 01/28/17 1104      Chief Complaint  Patient presents with  . non reactive FH tracing   HPI Tina Grant is a 26 y.o. G3P2 at 6427w5d who presents from the office for non reactive NST. Reports occasional contractions that are intermittently painful. Denies LOF or vaginal bleeding. Positive fetal movement.   OB History    Gravida Para Term Preterm AB Living   3 2       2    SAB TAB Ectopic Multiple Live Births           2      Obstetric Comments   NRFHR for 1st C-section 2nd C-section for repeat.      Past Medical History:  Diagnosis Date  . Anemia   . Asthma   . Complication of anesthesia   . Gestational diabetes    diet controlled  . Infection    uti  . MRSA infection     Past Surgical History:  Procedure Laterality Date  . CESAREAN SECTION    . CESAREAN SECTION N/A 02/11/2014    Family History  Problem Relation Age of Onset  . HIV Mother   . Cancer Maternal Grandmother        breast  . Heart disease Maternal Grandfather     Social History  Substance Use Topics  . Smoking status: Former Smoker    Types: Cigarettes  . Smokeless tobacco: Never Used     Comment: prior to first preg  . Alcohol use No    Allergies:  Allergies  Allergen Reactions  . Shellfish Allergy     Prescriptions Prior to Admission  Medication Sig Dispense Refill Last Dose  . albuterol (PROVENTIL) (2.5 MG/3ML) 0.083% nebulizer solution   11 Not Taking  . Blood Glucose Monitoring Suppl DEVI Accucheck Guide meter,lancet #100 prn RF, test strip # 100 Testing : fasting am and 2 hours after meals 1 each prn Taking  . doxylamine, Sleep, (UNISOM) 25 MG tablet Take 25 mg by mouth at bedtime as needed.   Not Taking  . glucose blood (ACCU-CHEK GUIDE) test strip Check blood glucose levels four times daily 100 each 12 Taking  . glyBURIDE (DIABETA) 2.5 MG tablet Take 1 tablet (2.5 mg  total) by mouth at bedtime. 30 tablet 1 Taking  . Prenatal Vit-Fe Fumarate-FA (MULTIVITAMIN-PRENATAL) 27-0.8 MG TABS tablet Take 1 tablet by mouth daily at 12 noon.   Taking  . Prenatal-DSS-FeCb-FeGl-FA (CITRANATAL BLOOM) 90-1 MG TABS Take 1 tablet by mouth daily. 30 tablet 12 Taking  . PROAIR HFA 108 (90 Base) MCG/ACT inhaler Inhale 2 puffs into the lungs every 4 (four) hours as needed for wheezing or shortness of breath. 18 g PRN Taking    Review of Systems  Constitutional: Negative.   Gastrointestinal: Positive for abdominal pain. Negative for constipation, diarrhea, nausea and vomiting.  Genitourinary: Negative.    Physical Exam  Dilation: Closed Cervical Position: Middle Exam by:: Charna Archerj. McClellan, RN   Blood pressure 100/73, pulse (!) 112, temperature 98.9 F (37.2 C), temperature source Oral, resp. rate 18, last menstrual period 05/16/2016, SpO2 100 %, unknown if currently breastfeeding.  Physical Exam  Nursing note and vitals reviewed. Constitutional: She is oriented to person, place, and time. She appears well-developed and well-nourished. No distress.  HENT:  Head: Normocephalic and atraumatic.  Eyes: Conjunctivae are normal. Right eye exhibits no discharge. Left  eye exhibits no discharge. No scleral icterus.  Neck: Normal range of motion.  Respiratory: Effort normal. No respiratory distress.  GI: Soft. There is no tenderness.  Neurological: She is alert and oriented to person, place, and time.  Skin: Skin is warm and dry. She is not diaphoretic.  Psychiatric: She has a normal mood and affect. Her behavior is normal. Judgment and thought content normal.   Fetal Tracing:  Baseline: 135 Variability: moderate Accelerations: 15x15 Decelerations: late x 1  Toco: Q3-6 mins MAU Course  Procedures No results found for this or any previous visit (from the past 24 hour(s)). [place] Mfm Fetal Bpp Wo Non Stress  Result Date:  01/28/2017 ----------------------------------------------------------------------  OBSTETRICS REPORT                      (Signed Final 01/28/2017 01:44 pm) ---------------------------------------------------------------------- Patient Info  ID #:       [id number]                         D.O.B.:   04/14/1991 (26 yrs)  Name:       Tina Grant                     Visit Date:  01/28/2017 11:34 am ---------------------------------------------------------------------- Performed By  Performed By:     Tommie RaymondMesha Tester BS,       Ref. Address:     [address]                    [address], [address]                                                             Road  Attending:        Clarene CritchleyPaul W Whitecar        Secondary Phy.:   MAU Nursing-                    MD                                                             MAU/Triage  Referred By:      Judeth HornERIN Malike Foglio          [facility]                    CNM ---------------------------------------------------------------------- Orders   #  Description                                 Code   1  US MFM FETAL BPP WO NON STRESS              76819.01  ----------------------------------------------------------------------   #  Ordered By               Order #        Accession #    Episode #   1  Tayshon Winker  Lyman Bishop            [id number]      [id number]     [id number]  ---------------------------------------------------------------------- Indications   [redacted] weeks gestation of pregnancy                Z3A.36   Gestational diabetes in pregnancy,             O24.415   controlled by oral hypoglycemic drugs   Maternal morbid obesity                        O99.210 E66.01   History of cesarean delivery, currently        O34.219   pregnant   Non-reactive NST                               O28.9  ---------------------------------------------------------------------- OB History  Blood Type:            Height:  5'6"   Weight (lb):  370      BMI:   59.71  Gravidity:    3         Term:   2        Prem:    0        SAB:   0  TOP:          0       Ectopic:  0        Living: 2 ---------------------------------------------------------------------- Fetal Evaluation  Num Of Fetuses:     1  Fetal Heart         150  Rate(bpm):  Cardiac Activity:   Observed  Presentation:       Cephalic  Placenta:           Posterior, above cervical os  P. Cord Insertion:  Previously Visualized  Amniotic Fluid  AFI FV:      Subjectively upper-normal  AFI Sum(cm)     %Tile       Largest Pocket(cm)  18.2            69          6.16  RUQ(cm)       RLQ(cm)       LUQ(cm)        LLQ(cm)  6.16          3.66          4.67           3.71 ---------------------------------------------------------------------- Biophysical Evaluation  Amniotic F.V:   Within normal limits       F. Tone:        Observed  F. Movement:    Observed                   Score:          8/8  F. Breathing:   Observed ---------------------------------------------------------------------- Gestational Age  LMP:           36w 5d       Date:   05/16/16                 EDD:   02/20/17  Best:          36w 5d    Det. By:   LMP  (05/16/16)          EDD:   02/20/17 ---------------------------------------------------------------------- Cervix Uterus Adnexa  Cervix  Not  visualized (advanced GA >29wks)  Uterus  No abnormality visualized.  Cul De Sac:   No free fluid seen.  Adnexa:       No abnormality visualized. ---------------------------------------------------------------------- Impression  Single IUP at 36w 5d  Gestational diabetes, morbid obesity  Cephalic presentation  BPP 8/8  Normal amniotic fluid volume ---------------------------------------------------------------------- Recommendations  Follow-up ultrasounds as clinically indicated. ----------------------------------------------------------------------                Candis Shine, MD Electronically Signed Final Report   01/28/2017 01:44 pm ----------------------------------------------------------------------   MDM Initially  fetal tracing in MAU was reassuring but not reactive. BPP ordered & was 8/8 with normal AFI. RN placed patient on monitor after returning from ultrasound; initially patient had isolated late deceleration followed by reactive tracing.  Strip reviewed by Dr. Vergie Living. Ok to discharge home.   Assessment and Plan  A: 1. NST (non-stress test) reactive   2. NST (non-stress test) nonreactive   3. [redacted] weeks gestation of pregnancy    P: Discharge home Fetal kick count form Keep scheduled f/u in office Discussed reasons to return to MAU  Judeth Horn 01/28/2017, 11:03 AM

## 2017-01-28 NOTE — MAU Note (Signed)
Was at office for bi-weekly NST, not reactive sent over for further eval.

## 2017-01-28 NOTE — Discharge Instructions (Signed)

## 2017-01-31 ENCOUNTER — Encounter (HOSPITAL_COMMUNITY): Payer: Self-pay

## 2017-02-01 ENCOUNTER — Other Ambulatory Visit: Payer: BLUE CROSS/BLUE SHIELD

## 2017-02-01 ENCOUNTER — Ambulatory Visit (INDEPENDENT_AMBULATORY_CARE_PROVIDER_SITE_OTHER): Payer: BLUE CROSS/BLUE SHIELD | Admitting: Obstetrics and Gynecology

## 2017-02-01 VITALS — BP 101/70 | HR 134 | Wt 358.0 lb

## 2017-02-01 DIAGNOSIS — O24415 Gestational diabetes mellitus in pregnancy, controlled by oral hypoglycemic drugs: Secondary | ICD-10-CM | POA: Diagnosis not present

## 2017-02-01 DIAGNOSIS — O0990 Supervision of high risk pregnancy, unspecified, unspecified trimester: Secondary | ICD-10-CM

## 2017-02-01 DIAGNOSIS — O34219 Maternal care for unspecified type scar from previous cesarean delivery: Secondary | ICD-10-CM

## 2017-02-01 DIAGNOSIS — Z98891 History of uterine scar from previous surgery: Secondary | ICD-10-CM

## 2017-02-01 DIAGNOSIS — R8271 Bacteriuria: Secondary | ICD-10-CM

## 2017-02-01 DIAGNOSIS — O0993 Supervision of high risk pregnancy, unspecified, third trimester: Secondary | ICD-10-CM

## 2017-02-01 NOTE — Progress Notes (Signed)
Subjective:  Tina Grant is a 26 y.o. G3P2 at 5311w2d being seen today for ongoing prenatal care.  She is currently monitored for the following issues for this high-risk pregnancy and has Supervision of high risk pregnancy, antepartum; Morbid obesity (HCC); H/O: C-section; GBS bacteriuria; History of asthma; Gestational diabetes mellitus (GDM) in third trimester; and Maternal iron deficiency anemia affecting pregnancy, antepartum, third trimester on her problem list.  Patient reports no complaints.  Contractions: Irregular. Vag. Bleeding: None.  Movement: Present. Denies leaking of fluid.   The following portions of the patient's history were reviewed and updated as appropriate: allergies, current medications, past family history, past medical history, past social history, past surgical history and problem list. Problem list updated.  Objective:   Vitals:   02/01/17 0928  BP: 101/70  Pulse: (!) 134  Weight: (!) 358 lb (162.4 kg)    Fetal Status: Fetal Heart Rate (bpm): NST/AFI   Movement: Present     General:  Alert, oriented and cooperative. Patient is in no acute distress.  Skin: Skin is warm and dry. No rash noted.   Cardiovascular: Normal heart rate noted  Respiratory: Normal respiratory effort, no problems with respiration noted  Abdomen: Soft, gravid, appropriate for gestational age. Pain/Pressure: Present     Pelvic:  Cervical exam deferred        Extremities: Normal range of motion.  Edema: Trace  Mental Status: Normal mood and affect. Normal behavior. Normal judgment and thought content.   Urinalysis:      Assessment and Plan:  Pregnancy: G3P2 at 3811w2d  1. Supervision of high risk pregnancy, antepartum Labor precautions  2. Gestational diabetes mellitus (GDM) in third trimester controlled on oral hypoglycemic drug Reactive NST and low normal AFI today Continue with antenatal testing - US OB Limited; Future - Fetal nonstress test  3. GBS bacteriuria Tx while in  labor  4. H/O: C-section Repeat c section 02/13/17  Term labor symptoms and general obstetric precautions including but not limited to vaginal bleeding, contractions, leaking of fluid and fetal movement were reviewed in detail with the patient. Please refer to After Visit Summary for other counseling recommendations.  Return in about 1 week (around 02/08/2017) for OB visit.   Hermina StaggersErvin, Albin Duckett L, MD

## 2017-02-03 NOTE — Addendum Note (Signed)
Addended by: Pennie BanterSMITH, Valine Drozdowski W on: 02/03/2017 10:21 AM   Modules accepted: Orders

## 2017-02-04 ENCOUNTER — Ambulatory Visit (INDEPENDENT_AMBULATORY_CARE_PROVIDER_SITE_OTHER): Payer: BLUE CROSS/BLUE SHIELD | Admitting: Certified Nurse Midwife

## 2017-02-04 DIAGNOSIS — O24415 Gestational diabetes mellitus in pregnancy, controlled by oral hypoglycemic drugs: Secondary | ICD-10-CM

## 2017-02-04 DIAGNOSIS — Z23 Encounter for immunization: Secondary | ICD-10-CM

## 2017-02-04 DIAGNOSIS — O0990 Supervision of high risk pregnancy, unspecified, unspecified trimester: Secondary | ICD-10-CM

## 2017-02-04 NOTE — Progress Notes (Signed)
Patient is in the office for NST, reports good fetal movement, denies pain.  TDAP given in left deltiod. Tolerated well.

## 2017-02-04 NOTE — Progress Notes (Signed)
NST: + accels, no decels, moderate variability, Cat. 1 tracing. No contractions on toco.   

## 2017-02-08 ENCOUNTER — Ambulatory Visit (HOSPITAL_COMMUNITY)
Admission: RE | Admit: 2017-02-08 | Discharge: 2017-02-08 | Disposition: A | Discharge status: Home / Self Care | Payer: BLUE CROSS/BLUE SHIELD | Source: Ambulatory Visit | Attending: Obstetrics and Gynecology | Admitting: Obstetrics and Gynecology

## 2017-02-08 ENCOUNTER — Other Ambulatory Visit: Payer: BLUE CROSS/BLUE SHIELD

## 2017-02-08 ENCOUNTER — Other Ambulatory Visit: Payer: Self-pay | Admitting: Obstetrics and Gynecology

## 2017-02-08 ENCOUNTER — Ambulatory Visit (INDEPENDENT_AMBULATORY_CARE_PROVIDER_SITE_OTHER): Payer: BLUE CROSS/BLUE SHIELD | Admitting: Obstetrics and Gynecology

## 2017-02-08 VITALS — BP 100/62 | HR 132 | Wt 363.6 lb

## 2017-02-08 DIAGNOSIS — O99213 Obesity complicating pregnancy, third trimester: Secondary | ICD-10-CM | POA: Insufficient documentation

## 2017-02-08 DIAGNOSIS — O24415 Gestational diabetes mellitus in pregnancy, controlled by oral hypoglycemic drugs: Secondary | ICD-10-CM | POA: Insufficient documentation

## 2017-02-08 DIAGNOSIS — Z3A38 38 weeks gestation of pregnancy: Secondary | ICD-10-CM | POA: Diagnosis not present

## 2017-02-08 DIAGNOSIS — O99013 Anemia complicating pregnancy, third trimester: Secondary | ICD-10-CM

## 2017-02-08 DIAGNOSIS — O0990 Supervision of high risk pregnancy, unspecified, unspecified trimester: Secondary | ICD-10-CM

## 2017-02-08 DIAGNOSIS — R8271 Bacteriuria: Secondary | ICD-10-CM

## 2017-02-08 DIAGNOSIS — D509 Iron deficiency anemia, unspecified: Secondary | ICD-10-CM

## 2017-02-08 DIAGNOSIS — O0993 Supervision of high risk pregnancy, unspecified, third trimester: Secondary | ICD-10-CM

## 2017-02-08 NOTE — Progress Notes (Signed)
   PRENATAL VISIT NOTE  Subjective:  Tina Grant is a 26 y.o. G3P2 at 7927w2d being seen today for ongoing prenatal care.  She is currently monitored for the following issues for this high-risk pregnancy and has Supervision of high risk pregnancy, antepartum; Morbid obesity (HCC); H/O: C-section; GBS bacteriuria; History of asthma; Gestational diabetes mellitus (GDM) in third trimester; and Maternal iron deficiency anemia affecting pregnancy, antepartum, third trimester on her problem list.  Patient reports no complaints.  Contractions: Irregular. Vag. Bleeding: None.  Movement: Present. Denies leaking of fluid.   The following portions of the patient's history were reviewed and updated as appropriate: allergies, current medications, past family history, past medical history, past social history, past surgical history and problem list. Problem list updated.  Objective:   Vitals:   02/08/17 0956  BP: 100/62  Pulse: (!) 132  Weight: (!) 363 lb 9.6 oz (164.9 kg)    Fetal Status: Fetal Heart Rate (bpm): NST    Movement: Present     General:  Alert, oriented and cooperative. Patient is in no acute distress.  Skin: Skin is warm and dry. No rash noted.   Cardiovascular: Normal heart rate noted  Respiratory: Normal respiratory effort, no problems with respiration noted  Abdomen: Soft, gravid, appropriate for gestational age.  Pain/Pressure: Present     Pelvic: Cervical exam deferred        Extremities: Normal range of motion.  Edema: Mild pitting, slight indentation  Mental Status:  Normal mood and affect. Normal behavior. Normal judgment and thought content.   Assessment and Plan:  Pregnancy: G3P2 at 8227w2d  1. Supervision of high risk pregnancy, antepartum Patient is doing well Scheduled for a repeat c-section on 8/12 Patient decided that she desires a BTL but medicaid form not signed in time  2. Gestational diabetes mellitus (GDM) in third trimester controlled on oral hypoglycemic  drug CBGs reviewed and all within range - Fetal nonstress test- NST reviewed and reactive with baseline 130, mod variability, +accels, no decels  3. Maternal iron deficiency anemia affecting pregnancy, antepartum, third trimester Continue iron supplements  4. GBS bacteriuria   Term labor symptoms and general obstetric precautions including but not limited to vaginal bleeding, contractions, leaking of fluid and fetal movement were reviewed in detail with the patient. Please refer to After Visit Summary for other counseling recommendations.  Return in about 4 weeks (around 03/08/2017) for postpartum visit and IUD insertion.   Catalina AntiguaPeggy Evee Liska, MD

## 2017-02-11 ENCOUNTER — Ambulatory Visit (INDEPENDENT_AMBULATORY_CARE_PROVIDER_SITE_OTHER): Payer: BLUE CROSS/BLUE SHIELD

## 2017-02-11 ENCOUNTER — Encounter (HOSPITAL_COMMUNITY)
Admission: RE | Admit: 2017-02-11 | Discharge: 2017-02-11 | Disposition: A | Discharge status: Home / Self Care | Payer: BLUE CROSS/BLUE SHIELD | Source: Ambulatory Visit | Attending: Obstetrics & Gynecology | Admitting: Obstetrics & Gynecology

## 2017-02-11 VITALS — BP 114/74 | HR 114 | Wt 362.0 lb

## 2017-02-11 DIAGNOSIS — O24415 Gestational diabetes mellitus in pregnancy, controlled by oral hypoglycemic drugs: Secondary | ICD-10-CM | POA: Diagnosis not present

## 2017-02-11 LAB — CBC
HEMATOCRIT: 45.6 % (ref 36.0–46.0)
Hemoglobin: 14.9 g/dL (ref 12.0–15.0)
MCH: 21.4 pg — ABNORMAL LOW (ref 26.0–34.0)
MCHC: 32.7 g/dL (ref 30.0–36.0)
MCV: 65.5 fL — ABNORMAL LOW (ref 78.0–100.0)
PLATELETS: 163 10*3/uL (ref 150–400)
RBC: 6.96 MIL/uL — AB (ref 3.87–5.11)
RDW: 18.9 % — AB (ref 11.5–15.5)
WBC: 5.3 10*3/uL (ref 4.0–10.5)

## 2017-02-11 LAB — TYPE AND SCREEN
ABO/RH(D): O POS
Antibody Screen: NEGATIVE

## 2017-02-11 LAB — ABO/RH: ABO/RH(D): O POS

## 2017-02-11 NOTE — Progress Notes (Signed)
Nurse visit for NST only dx: GDM on oral meds. NST Reactive reviewed with CNM.

## 2017-02-11 NOTE — Addendum Note (Signed)
Addended by: Dalphine HandingGARDNER, Thanya Cegielski L on: 02/11/2017 10:50 AM   Modules accepted: Orders

## 2017-02-11 NOTE — Patient Instructions (Signed)
18 Tina Grant  02/11/2017   Your procedure is scheduled on:  02/13/2017  [facility] at 0715 AM.  Pick up the phone at the desk and dial [phone].   Call this number if you have problems the morning of surgery: [phone]   Remember:   Do not eat food:After Midnight.  Do not drink clear liquids: After Midnight.  Take these medicines the morning of surgery with A SIP OF WATER: may take zantac.  Do not take glyburide in the monng if  you take it in the morning regularly.  Please bring your inhaler.   Do not wear jewelry, make-up or nail polish.  Do not wear lotions, powders, or perfumes. Do not wear deodorant.  Do not shave 48 hours prior to surgery.  Do not bring valuables to the hospital.  [facility] is not   responsible for any belongings or valuables brought to the hospital.  Contacts, dentures or bridgework may not be worn into surgery.  Leave suitcase in the car. After surgery it may be brought to your room.  For patients admitted to the hospital, checkout time is 11:00 AM the day of              discharge.   Patients discharged the day of surgery will not be allowed to drive             home.  Name and phone number of your driver: na  Special Instructions:   N/A   Please read over the following fact sheets that you were given:   Surgical Site Infection Prevention

## 2017-02-12 LAB — RPR: RPR Ser Ql: NONREACTIVE

## 2017-02-13 ENCOUNTER — Inpatient Hospital Stay (HOSPITAL_COMMUNITY): Payer: BLUE CROSS/BLUE SHIELD | Admitting: Anesthesiology

## 2017-02-13 ENCOUNTER — Encounter (HOSPITAL_COMMUNITY)
Admission: RE | Disposition: A | Discharge status: Home / Self Care | Payer: Self-pay | Source: Ambulatory Visit | Attending: Obstetrics & Gynecology

## 2017-02-13 ENCOUNTER — Inpatient Hospital Stay (HOSPITAL_COMMUNITY)
Admission: RE | Admit: 2017-02-13 | Discharge: 2017-02-15 | DRG: 765 | Disposition: A | Discharge status: Home / Self Care | Payer: BLUE CROSS/BLUE SHIELD | Source: Ambulatory Visit | Attending: Obstetrics & Gynecology | Admitting: Obstetrics & Gynecology

## 2017-02-13 ENCOUNTER — Encounter (HOSPITAL_COMMUNITY): Payer: Self-pay

## 2017-02-13 DIAGNOSIS — J45909 Unspecified asthma, uncomplicated: Secondary | ICD-10-CM | POA: Diagnosis present

## 2017-02-13 DIAGNOSIS — O9952 Diseases of the respiratory system complicating childbirth: Secondary | ICD-10-CM | POA: Diagnosis present

## 2017-02-13 DIAGNOSIS — Z6841 Body Mass Index (BMI) 40.0 and over, adult: Secondary | ICD-10-CM

## 2017-02-13 DIAGNOSIS — Z98891 History of uterine scar from previous surgery: Secondary | ICD-10-CM

## 2017-02-13 DIAGNOSIS — O34211 Maternal care for low transverse scar from previous cesarean delivery: Principal | ICD-10-CM | POA: Diagnosis present

## 2017-02-13 DIAGNOSIS — Z3A39 39 weeks gestation of pregnancy: Secondary | ICD-10-CM | POA: Diagnosis not present

## 2017-02-13 DIAGNOSIS — O9902 Anemia complicating childbirth: Secondary | ICD-10-CM | POA: Diagnosis present

## 2017-02-13 DIAGNOSIS — Z87891 Personal history of nicotine dependence: Secondary | ICD-10-CM

## 2017-02-13 DIAGNOSIS — D649 Anemia, unspecified: Secondary | ICD-10-CM | POA: Diagnosis present

## 2017-02-13 DIAGNOSIS — O99214 Obesity complicating childbirth: Secondary | ICD-10-CM | POA: Diagnosis present

## 2017-02-13 DIAGNOSIS — O24425 Gestational diabetes mellitus in childbirth, controlled by oral hypoglycemic drugs: Secondary | ICD-10-CM | POA: Diagnosis present

## 2017-02-13 SURGERY — Surgical Case
Anesthesia: Spinal

## 2017-02-13 MED ORDER — MENTHOL 3 MG MT LOZG: 1.0000 | LOZENGE | OROMUCOSAL | Status: DC | PRN

## 2017-02-13 MED ORDER — ACETAMINOPHEN 325 MG PO TABS
650.0000 mg | ORAL_TABLET | ORAL | Status: DC | PRN
Start: 2017-02-13 — End: 2017-02-15
  Administered 2017-02-15: 650 mg via ORAL
  Filled 2017-02-13: qty 2

## 2017-02-13 MED ORDER — ONDANSETRON HCL 4 MG/2ML IJ SOLN: 4.0000 mg | Freq: Three times a day (TID) | INTRAMUSCULAR | Status: DC | PRN

## 2017-02-13 MED ORDER — NALOXONE HCL 0.4 MG/ML IJ SOLN: 0.4000 mg | INTRAMUSCULAR | Status: DC | PRN

## 2017-02-13 MED ORDER — DEXTROSE 5 % IV SOLN
INTRAVENOUS | Status: DC | PRN
  Administered 2017-02-13: 3 g via INTRAVENOUS

## 2017-02-13 MED ORDER — KETOROLAC TROMETHAMINE 30 MG/ML IJ SOLN: 30.0000 mg | Freq: Four times a day (QID) | INTRAMUSCULAR | Status: AC | PRN

## 2017-02-13 MED ORDER — OXYTOCIN 40 UNITS IN LACTATED RINGERS INFUSION - SIMPLE MED: 2.5000 [IU]/h | INTRAVENOUS | Status: AC

## 2017-02-13 MED ORDER — ONDANSETRON HCL 4 MG/2ML IJ SOLN
INTRAMUSCULAR | Status: DC | PRN
  Administered 2017-02-13: 4 mg via INTRAVENOUS

## 2017-02-13 MED ORDER — ALBUTEROL SULFATE (2.5 MG/3ML) 0.083% IN NEBU: 2.5000 mg | INHALATION_SOLUTION | RESPIRATORY_TRACT | Status: DC | PRN

## 2017-02-13 MED ORDER — SCOPOLAMINE 1 MG/3DAYS TD PT72
1.0000 | MEDICATED_PATCH | Freq: Once | TRANSDERMAL | Status: DC
  Administered 2017-02-13: 1.5 mg via TRANSDERMAL
  Filled 2017-02-13: qty 1

## 2017-02-13 MED ORDER — LACTATED RINGERS IV SOLN
INTRAVENOUS | Status: DC | PRN
  Administered 2017-02-13: 09:00:00 via INTRAVENOUS

## 2017-02-13 MED ORDER — PHENYLEPHRINE 8 MG IN D5W 100 ML (0.08MG/ML) PREMIX OPTIME
INJECTION | INTRAVENOUS | Status: DC | PRN
  Administered 2017-02-13: 60 ug/min via INTRAVENOUS

## 2017-02-13 MED ORDER — NALBUPHINE HCL 10 MG/ML IJ SOLN: 5.0000 mg | INTRAMUSCULAR | Status: DC | PRN

## 2017-02-13 MED ORDER — PHENYLEPHRINE HCL 10 MG/ML IJ SOLN
INTRAMUSCULAR | Status: DC | PRN
  Administered 2017-02-13: 40 ug via INTRAVENOUS
  Administered 2017-02-13 (×2): 80 ug via INTRAVENOUS
  Administered 2017-02-13 (×4): 40 ug via INTRAVENOUS

## 2017-02-13 MED ORDER — METHYLERGONOVINE MALEATE 0.2 MG PO TABS: 0.2000 mg | ORAL_TABLET | ORAL | Status: DC | PRN

## 2017-02-13 MED ORDER — BUPIVACAINE IN DEXTROSE 0.75-8.25 % IT SOLN
INTRATHECAL | Status: DC | PRN
  Administered 2017-02-13: 1.4 mL via INTRATHECAL

## 2017-02-13 MED ORDER — OXYTOCIN 10 UNIT/ML IJ SOLN
INTRAMUSCULAR | Status: AC
  Filled 2017-02-13: qty 4

## 2017-02-13 MED ORDER — NALBUPHINE HCL 10 MG/ML IJ SOLN: 5.0000 mg | Freq: Once | INTRAMUSCULAR | Status: DC | PRN

## 2017-02-13 MED ORDER — LACTATED RINGERS IV SOLN: INTRAVENOUS | Status: DC

## 2017-02-13 MED ORDER — FENTANYL CITRATE (PF) 100 MCG/2ML IJ SOLN: 25.0000 ug | INTRAMUSCULAR | Status: DC | PRN

## 2017-02-13 MED ORDER — MORPHINE SULFATE (PF) 0.5 MG/ML IJ SOLN
INTRAMUSCULAR | Status: AC
  Filled 2017-02-13: qty 10

## 2017-02-13 MED ORDER — LACTATED RINGERS IV SOLN
INTRAVENOUS | Status: DC | PRN
  Administered 2017-02-13 (×2): via INTRAVENOUS

## 2017-02-13 MED ORDER — PNEUMOCOCCAL VAC POLYVALENT 25 MCG/0.5ML IJ INJ: 0.5000 mL | INJECTION | INTRAMUSCULAR | Status: DC | PRN

## 2017-02-13 MED ORDER — FENTANYL CITRATE (PF) 100 MCG/2ML IJ SOLN
INTRAMUSCULAR | Status: AC
  Filled 2017-02-13: qty 2

## 2017-02-13 MED ORDER — DEXTROSE 5 % IV SOLN
INTRAVENOUS | Status: AC
  Filled 2017-02-13: qty 3000

## 2017-02-13 MED ORDER — KETOROLAC TROMETHAMINE 30 MG/ML IJ SOLN
INTRAMUSCULAR | Status: AC
  Administered 2017-02-13: 30 mg
  Filled 2017-02-13: qty 1

## 2017-02-13 MED ORDER — LACTATED RINGERS IV SOLN
INTRAVENOUS | Status: DC | PRN
  Administered 2017-02-13: 40 [IU] via INTRAVENOUS

## 2017-02-13 MED ORDER — SCOPOLAMINE 1 MG/3DAYS TD PT72
MEDICATED_PATCH | TRANSDERMAL | Status: AC
  Filled 2017-02-13: qty 1

## 2017-02-13 MED ORDER — LACTATED RINGERS IV SOLN
INTRAVENOUS | Status: DC
  Administered 2017-02-13 (×2): via INTRAVENOUS

## 2017-02-13 MED ORDER — COCONUT OIL OIL: 1.0000 "application " | TOPICAL_OIL | Status: DC | PRN

## 2017-02-13 MED ORDER — SODIUM CHLORIDE 0.9% FLUSH: 3.0000 mL | INTRAVENOUS | Status: DC | PRN

## 2017-02-13 MED ORDER — ONDANSETRON HCL 4 MG/2ML IJ SOLN
INTRAMUSCULAR | Status: AC
  Filled 2017-02-13: qty 2

## 2017-02-13 MED ORDER — DIBUCAINE 1 % RE OINT: 1.0000 "application " | TOPICAL_OINTMENT | RECTAL | Status: DC | PRN

## 2017-02-13 MED ORDER — SCOPOLAMINE 1 MG/3DAYS TD PT72
MEDICATED_PATCH | TRANSDERMAL | Status: DC | PRN
  Administered 2017-02-13: 1 via TRANSDERMAL

## 2017-02-13 MED ORDER — PRENATAL MULTIVITAMIN CH
1.0000 | ORAL_TABLET | Freq: Every day | ORAL | Status: DC
  Administered 2017-02-14: 1 via ORAL
  Filled 2017-02-13: qty 1

## 2017-02-13 MED ORDER — MEASLES, MUMPS & RUBELLA VAC ~~LOC~~ INJ
0.5000 mL | INJECTION | Freq: Once | SUBCUTANEOUS | Status: DC
  Filled 2017-02-13: qty 0.5

## 2017-02-13 MED ORDER — NALOXONE HCL 2 MG/2ML IJ SOSY
1.0000 ug/kg/h | PREFILLED_SYRINGE | INTRAVENOUS | Status: DC | PRN
  Filled 2017-02-13: qty 2

## 2017-02-13 MED ORDER — BUPIVACAINE IN DEXTROSE 0.75-8.25 % IT SOLN
INTRATHECAL | Status: AC
Start: 2017-02-13 — End: 2017-02-13
  Filled 2017-02-13: qty 2

## 2017-02-13 MED ORDER — METOCLOPRAMIDE HCL 5 MG/ML IJ SOLN
INTRAMUSCULAR | Status: AC
  Filled 2017-02-13: qty 2

## 2017-02-13 MED ORDER — METOCLOPRAMIDE HCL 5 MG/ML IJ SOLN
10.0000 mg | Freq: Once | INTRAMUSCULAR | Status: AC | PRN
  Administered 2017-02-13: 10 mg via INTRAVENOUS

## 2017-02-13 MED ORDER — SIMETHICONE 80 MG PO CHEW
80.0000 mg | CHEWABLE_TABLET | Freq: Three times a day (TID) | ORAL | Status: DC
  Administered 2017-02-14 – 2017-02-15 (×3): 80 mg via ORAL
  Filled 2017-02-13 (×3): qty 1

## 2017-02-13 MED ORDER — MEPERIDINE HCL 25 MG/ML IJ SOLN: 6.2500 mg | INTRAMUSCULAR | Status: DC | PRN

## 2017-02-13 MED ORDER — DIPHENHYDRAMINE HCL 50 MG/ML IJ SOLN
12.5000 mg | INTRAMUSCULAR | Status: DC | PRN
  Administered 2017-02-13 (×2): 12.5 mg via INTRAVENOUS
  Filled 2017-02-13 (×2): qty 1

## 2017-02-13 MED ORDER — DIPHENHYDRAMINE HCL 25 MG PO CAPS: 25.0000 mg | ORAL_CAPSULE | ORAL | Status: DC | PRN

## 2017-02-13 MED ORDER — ACETAMINOPHEN 500 MG PO TABS
1000.0000 mg | ORAL_TABLET | Freq: Four times a day (QID) | ORAL | Status: AC
  Administered 2017-02-13 – 2017-02-14 (×3): 1000 mg via ORAL
  Filled 2017-02-13 (×2): qty 2

## 2017-02-13 MED ORDER — LACTATED RINGERS IV SOLN
INTRAVENOUS | Status: DC
  Administered 2017-02-13: 08:00:00 via INTRAVENOUS

## 2017-02-13 MED ORDER — SIMETHICONE 80 MG PO CHEW: 80.0000 mg | CHEWABLE_TABLET | ORAL | Status: DC | PRN

## 2017-02-13 MED ORDER — FENTANYL CITRATE (PF) 100 MCG/2ML IJ SOLN
INTRAMUSCULAR | Status: DC | PRN
  Administered 2017-02-13: 10 ug via INTRATHECAL

## 2017-02-13 MED ORDER — SIMETHICONE 80 MG PO CHEW
80.0000 mg | CHEWABLE_TABLET | ORAL | Status: DC
  Administered 2017-02-13 – 2017-02-14 (×2): 80 mg via ORAL
  Filled 2017-02-13 (×3): qty 1

## 2017-02-13 MED ORDER — TETANUS-DIPHTH-ACELL PERTUSSIS 5-2.5-18.5 LF-MCG/0.5 IM SUSP: 0.5000 mL | Freq: Once | INTRAMUSCULAR | Status: DC

## 2017-02-13 MED ORDER — WITCH HAZEL-GLYCERIN EX PADS: 1.0000 "application " | MEDICATED_PAD | CUTANEOUS | Status: DC | PRN

## 2017-02-13 MED ORDER — CITRANATAL BLOOM 90-1 MG PO TABS: 1.0000 | ORAL_TABLET | Freq: Every day | ORAL | Status: DC

## 2017-02-13 MED ORDER — IBUPROFEN 600 MG PO TABS
600.0000 mg | ORAL_TABLET | Freq: Four times a day (QID) | ORAL | Status: DC
  Administered 2017-02-13 – 2017-02-15 (×7): 600 mg via ORAL
  Filled 2017-02-13 (×7): qty 1

## 2017-02-13 MED ORDER — SODIUM CHLORIDE 0.9 % IR SOLN
Status: DC | PRN
  Administered 2017-02-13: 1000 mL

## 2017-02-13 MED ORDER — ZOLPIDEM TARTRATE 5 MG PO TABS: 5.0000 mg | ORAL_TABLET | Freq: Every evening | ORAL | Status: DC | PRN

## 2017-02-13 MED ORDER — MORPHINE SULFATE (PF) 0.5 MG/ML IJ SOLN
INTRAMUSCULAR | Status: DC | PRN
  Administered 2017-02-13: .2 mg via INTRATHECAL

## 2017-02-13 MED ORDER — DIPHENHYDRAMINE HCL 25 MG PO CAPS: 25.0000 mg | ORAL_CAPSULE | Freq: Four times a day (QID) | ORAL | Status: DC | PRN

## 2017-02-13 MED ORDER — NALBUPHINE HCL 10 MG/ML IJ SOLN
5.0000 mg | INTRAMUSCULAR | Status: DC | PRN
  Administered 2017-02-13: 5 mg via INTRAVENOUS
  Filled 2017-02-13: qty 1

## 2017-02-13 MED ORDER — METHYLERGONOVINE MALEATE 0.2 MG/ML IJ SOLN: 0.2000 mg | INTRAMUSCULAR | Status: DC | PRN

## 2017-02-13 MED ORDER — CEFAZOLIN SODIUM-DEXTROSE 2-4 GM/100ML-% IV SOLN: 2.0000 g | INTRAVENOUS | Status: DC

## 2017-02-13 MED ORDER — SENNOSIDES-DOCUSATE SODIUM 8.6-50 MG PO TABS
2.0000 | ORAL_TABLET | ORAL | Status: DC
  Administered 2017-02-13 – 2017-02-14 (×2): 2 via ORAL
  Filled 2017-02-13 (×2): qty 2

## 2017-02-13 SURGICAL SUPPLY — 37 items
CHLORAPREP W/TINT 26ML (MISCELLANEOUS) ×6 IMPLANT
CLAMP CORD UMBIL (MISCELLANEOUS) IMPLANT
CLOTH BEACON ORANGE TIMEOUT ST (SAFETY) ×3 IMPLANT
DERMABOND ADVANCED (GAUZE/BANDAGES/DRESSINGS) ×4
DERMABOND ADVANCED .7 DNX12 (GAUZE/BANDAGES/DRESSINGS) ×2 IMPLANT
DRSG OPSITE POSTOP 4X10 (GAUZE/BANDAGES/DRESSINGS) ×3 IMPLANT
ELECT REM PT RETURN 9FT ADLT (ELECTROSURGICAL) ×3
ELECTRODE REM PT RTRN 9FT ADLT (ELECTROSURGICAL) ×1 IMPLANT
EXTRACTOR VACUUM BELL STYLE (SUCTIONS) IMPLANT
GLOVE BIOGEL PI IND STRL 7.0 (GLOVE) ×1 IMPLANT
GLOVE BIOGEL PI IND STRL 8 (GLOVE) ×1 IMPLANT
GLOVE BIOGEL PI INDICATOR 7.0 (GLOVE) ×2
GLOVE BIOGEL PI INDICATOR 8 (GLOVE) ×2
GLOVE ECLIPSE 8.0 STRL XLNG CF (GLOVE) ×3 IMPLANT
GOWN STRL REUS W/TWL LRG LVL3 (GOWN DISPOSABLE) ×6 IMPLANT
KIT ABG SYR 3ML LUER SLIP (SYRINGE) ×3 IMPLANT
NEEDLE HYPO 18GX1.5 BLUNT FILL (NEEDLE) ×3 IMPLANT
NEEDLE HYPO 22GX1.5 SAFETY (NEEDLE) ×3 IMPLANT
NEEDLE HYPO 25X5/8 SAFETYGLIDE (NEEDLE) ×3 IMPLANT
NS IRRIG 1000ML POUR BTL (IV SOLUTION) ×3 IMPLANT
PACK C SECTION WH (CUSTOM PROCEDURE TRAY) ×3 IMPLANT
PAD OB MATERNITY 4.3X12.25 (PERSONAL CARE ITEMS) ×3 IMPLANT
PENCIL SMOKE EVAC W/HOLSTER (ELECTROSURGICAL) ×3 IMPLANT
RTRCTR C-SECT PINK 25CM LRG (MISCELLANEOUS) ×3 IMPLANT
STAPLER VISISTAT 35W (STAPLE) ×3 IMPLANT
SUT CHROMIC 0 CT 1 (SUTURE) ×3 IMPLANT
SUT MNCRL 0 VIOLET CTX 36 (SUTURE) ×2 IMPLANT
SUT MONOCRYL 0 CTX 36 (SUTURE) ×4
SUT PLAIN 2 0 (SUTURE)
SUT PLAIN 2 0 XLH (SUTURE) IMPLANT
SUT PLAIN ABS 2-0 CT1 27XMFL (SUTURE) IMPLANT
SUT VIC AB 0 CTX 36 (SUTURE) ×2
SUT VIC AB 0 CTX36XBRD ANBCTRL (SUTURE) ×1 IMPLANT
SUT VIC AB 4-0 KS 27 (SUTURE) IMPLANT
SYR 20CC LL (SYRINGE) ×6 IMPLANT
TOWEL OR 17X24 6PK STRL BLUE (TOWEL DISPOSABLE) ×3 IMPLANT
TRAY FOLEY BAG SILVER LF 14FR (SET/KITS/TRAYS/PACK) IMPLANT

## 2017-02-13 NOTE — Op Note (Signed)
Preoperative diagnosis:  1.  Intrauterine pregnancy at 6836w0d  weeks gestation                                         2.  Previous Caesarean section x 2                                         3.  Class A2 DM   Postoperative diagnosis:  Same as above plus suspected fetal macrosomia  Procedure:  Repeat cesarean section, vacuum assisted fetal vertex extraction  Surgeon:  Lazaro ArmsLuther H Daviana Haymaker MD  Assistant:    Anesthesia: Spinal  Findings:  .    Over a low transverse incision was delivered a viable female with Apgars of 8 and 9 weighing pending lbs.  oz. Uterus, tubes and ovaries were all normal.  There were no other significant findings  Description of operation:  Patient was taken to the operating room and placed in the sitting position where she underwent a spinal anesthetic. She was then placed in the supine position with tilt to the left side. When adequate anesthetic level was obtained she was prepped and draped in usual sterile fashion and a Foley catheter was placed. A Pfannenstiel skin incision was made and carried down sharply to the rectus fascia which was scored in the midline extended laterally. The fascia was taken off the muscles both superiorly and without difficulty. The muscles were divided.  The peritoneal cavity was entered.  Bladder blade was placed, no bladder flap was created.  A low transverse hysterotomy incision was made and delivered a viable female  infant at [id number] with Apgars of 8 and 9  Weighing pending lbs  oz.  Cord pH was obtained and was pending.  Vacuum extractor was employed to facilitate the delivery of the fetal vertex.The uterus was exteriorized. It was closed in 2 layers, the first being a running interlocking layer and the second being an imbricating layer using 0 monocryl on a CTX needle. There was good resulting hemostasis. The uterus tubes and ovaries were all normal. Peritoneal cavity was irrigated vigorously. The muscles and peritoneum were reapproximated  loosely. The fascia was closed using 0 Vicryl in running fashion. Subcutaneous tissue was made hemostatic and irrigated. The skin was closed using skin staples because of her large pannus putting downward pressure on the top layer of the incision. Blood loss for the procedure was 700 cc. The patient received a 3 grams of Ancef prophylactically. The patient was taken to the recovery room in good stable condition with all counts being correct x3.  EBL 700 cc  Lucina Betty H 02/13/2017 10:17 AM

## 2017-02-13 NOTE — Consult Note (Signed)
Neonatology Note:   Attendance at C-section:    I was asked by Dr. Eure to attend this repeat C/S at term. The mother is a G3P2 O pos, GBS not found with obesity, polyhydramnios, and GDM on glyburide. ROM at delivery, fluid clear. Vacuum-assisted deliver. Infant vigorous with good spontaneous cry and tone. Needed only minimal bulb suctioning. Ap 8/9. Lungs clear to ausc in DR. Infant is LGA; spoke with parents about risk for hypoglycemia. To CN to care of Pediatrician.   Leola Fiore C. Elihu Milstein, MD 

## 2017-02-13 NOTE — Progress Notes (Signed)
Called faculty practice regarding pt's bleeding.  Last two checks this RN had to massage pt firm with a few dime size clots.  Per Dr. Sedalia Mutaox, check pt every 2-3 hours and weigh pads when changed.  Will call with update later this evening.

## 2017-02-13 NOTE — Plan of Care (Signed)
Problem: Role Relationship: Goal: Ability to demonstrate positive interaction with the child will improve Outcome: Completed/Met Date Met: 02/13/17 Mother and baby bonding well

## 2017-02-13 NOTE — Anesthesia Procedure Notes (Signed)
Spinal  Patient location during procedure: OR Staffing Anesthesiologist: Montez Hageman Performed: anesthesiologist  Preanesthetic Checklist Completed: patient identified, site marked, surgical consent, pre-op evaluation, timeout performed, IV checked, risks and benefits discussed and monitors and equipment checked Spinal Block Patient position: sitting Prep: DuraPrep Patient monitoring: heart rate, continuous pulse ox and blood pressure Approach: midline Location: L3-4 Injection technique: single-shot Needle Needle type: Sprotte  Needle gauge: 24 G Needle length: 9 cm Needle insertion depth: 11 cm Additional Notes Expiration date of kit checked and confirmed. Patient tolerated procedure well, without complications.

## 2017-02-13 NOTE — Transfer of Care (Signed)
Immediate Anesthesia Transfer of Care Note  Patient: Tina DistanceAhjia N Grant  Procedure(s) Performed: Procedure(s): REPEAT CESAREAN SECTION (N/A)  Patient Location: PACU  Anesthesia Type:Spinal  Level of Consciousness: awake and alert   Airway & Oxygen Therapy: Patient Spontanous Breathing and Patient connected to nasal cannula oxygen  Post-op Assessment: Report given to RN and Post -op Vital signs reviewed and stable  Post vital signs: Reviewed  Last Vitals:  Vitals:   02/13/17 0744 02/13/17 0755  BP: (!) 84/55 102/73  Pulse: (!) 148 (!) 132  Resp:  18  Temp:  36.5 C  SpO2:  99%    Last Pain:  Vitals:   02/13/17 0800  TempSrc:   PainSc: 0-No pain         Complications: No apparent anesthesia complications

## 2017-02-13 NOTE — Anesthesia Postprocedure Evaluation (Signed)
Anesthesia Post Note  Patient: Tina Grant  Procedure(s) Performed: Procedure(s) (LRB): REPEAT CESAREAN SECTION (N/A)     Patient location during evaluation: Mother Baby Anesthesia Type: Spinal Level of consciousness: awake Pain management: pain level controlled Vital Signs Assessment: post-procedure vital signs reviewed and stable Respiratory status: spontaneous breathing Cardiovascular status: stable Postop Assessment: no headache, no backache, spinal receding, patient able to bend at knees, no signs of nausea or vomiting and adequate PO intake Anesthetic complications: no    Last Vitals:  Vitals:   02/13/17 1400 02/13/17 1459  BP: 109/67 104/64  Pulse: 78 89  Resp: 18 18  Temp:  36.4 C  SpO2: 95% 95%    Last Pain:  Vitals:   02/13/17 1459  TempSrc: Oral  PainSc: 0-No pain   Pain Goal:                 Darold Miley

## 2017-02-13 NOTE — Anesthesia Preprocedure Evaluation (Addendum)
Anesthesia Evaluation  Patient identified by MRN, date of birth, ID band Patient awake    Reviewed: Allergy & Precautions, NPO status , Patient's Chart, lab work & pertinent test results  History of Anesthesia Complications Negative for: history of anesthetic complications  Airway Mallampati: II  TM Distance: >3 FB Neck ROM: Full    Dental no notable dental hx.    Pulmonary asthma , former smoker,    Pulmonary exam normal breath sounds clear to auscultation       Cardiovascular negative cardio ROS Normal cardiovascular exam Rhythm:Regular Rate:Normal     Neuro/Psych negative neurological ROS  negative psych ROS   GI/Hepatic negative GI ROS, Neg liver ROS,   Endo/Other  diabetes, GestationalMorbid obesity  Renal/GU negative Renal ROS  negative genitourinary   Musculoskeletal negative musculoskeletal ROS (+)   Abdominal   Peds negative pediatric ROS (+)  Hematology negative hematology ROS (+)   Anesthesia Other Findings   Reproductive/Obstetrics negative OB ROS                            Anesthesia Physical Anesthesia Plan  ASA: III  Anesthesia Plan: Spinal   Post-op Pain Management:    Induction:   PONV Risk Score and Plan: 2 and Ondansetron, Scopolamine patch - Pre-op and Treatment may vary due to age or medical condition  Airway Management Planned: Natural Airway  Additional Equipment:   Intra-op Plan:   Post-operative Plan:   Informed Consent: I have reviewed the patients History and Physical, chart, labs and discussed the procedure including the risks, benefits and alternatives for the proposed anesthesia with the patient or authorized representative who has indicated his/her understanding and acceptance.   Dental advisory given  Plan Discussed with:   Anesthesia Plan Comments:         Anesthesia Quick Evaluation

## 2017-02-13 NOTE — H&P (Signed)
Tina Grant is a 26 y.o. female presenting for repeat Caesarean section G3P2 Estimated Date of Delivery: 02/20/17 [redacted]w[redacted]d  A2 DM on glyburide 2.5 mg qhs . OB History    Gravida Para Term Preterm AB Living   3 2       2    SAB TAB Ectopic Multiple Live Births           2      Obstetric Comments   NRFHR for 1st C-section 2nd C-section for repeat.     Past Medical History:  Diagnosis Date  . Anemia   . Asthma   . Complication of anesthesia   . Gestational diabetes    diet controlled  . Infection    uti  . MRSA infection    Past Surgical History:  Procedure Laterality Date  . CESAREAN SECTION    . CESAREAN SECTION N/A 02/11/2014   Family History: family history includes Cancer in her maternal grandmother; HIV in her mother; Heart disease in her maternal grandfather. Social History:  reports that she has quit smoking. Her smoking use included Cigarettes. She has never used smokeless tobacco. She reports that she does not drink alcohol or use drugs.     Maternal Diabetes: Yes:  Diabetes Type:  Insulin/Medication controlled Genetic Screening: Normal Maternal Ultrasounds/Referrals: Normal Fetal Ultrasounds or other Referrals:  None Maternal Substance Abuse:  No Significant Maternal Medications:  Meds include: Other: glyburide 2.5 mg qhs Significant Maternal Lab Results:  None Other Comments:    ROS   Review of Systems  Constitutional: Negative for fever, chills, weight loss, malaise/fatigue and diaphoresis.  HENT: Negative for hearing loss, ear pain, nosebleeds, congestion, sore throat, neck pain, tinnitus and ear discharge.   Eyes: Negative for blurred vision, double vision, photophobia, pain, discharge and redness.  Respiratory: Negative for cough, hemoptysis, sputum production, shortness of breath, wheezing and stridor.   Cardiovascular: Negative for chest pain, palpitations, orthopnea, claudication, leg swelling and PND.  Gastrointestinal: negative for abdominal pain.  Negative for heartburn, nausea, vomiting, diarrhea, constipation, blood in stool and melena.  Genitourinary: Negative for dysuria, urgency, frequency, hematuria and flank pain.  Musculoskeletal: Negative for myalgias, back pain, joint pain and falls.  Skin: Negative for itching and rash.  Neurological: Negative for dizziness, tingling, tremors, sensory change, speech change, focal weakness, seizures, loss of consciousness, weakness and headaches.  Endo/Heme/Allergies: Negative for environmental allergies and polydipsia. Does not bruise/bleed easily.  Psychiatric/Behavioral: Negative for depression, suicidal ideas, hallucinations, memory loss and substance abuse. The patient is not nervous/anxious and does not have insomnia.      History  Past Medical History:  Diagnosis Date  . Anemia   . Asthma   . Complication of anesthesia   . Gestational diabetes    diet controlled  . Infection    uti  . MRSA infection     Past Surgical History:  Procedure Laterality Date  . CESAREAN SECTION    . CESAREAN SECTION N/A 02/11/2014    OB History    Gravida Para Term Preterm AB Living   3 2       2    SAB TAB Ectopic Multiple Live Births           2      Obstetric Comments   NRFHR for 1st C-section 2nd C-section for repeat.      Allergies  Allergen Reactions  . Shellfish Allergy Anaphylaxis and Hives    Social History   Social History  . Marital status: Single  Spouse name: N/A  . Number of children: N/A  . Years of education: N/A   Social History Main Topics  . Smoking status: Former Smoker    Types: Cigarettes  . Smokeless tobacco: Never Used     Comment: prior to first preg  . Alcohol use No  . Drug use: No  . Sexual activity: Yes    Birth control/ protection: None   Other Topics Concern  . None   Social History Narrative  . None    Family History  Problem Relation Age of Onset  . HIV Mother   . Cancer Maternal Grandmother        breast  . Heart disease  Maternal Grandfather       Blood pressure 102/73, pulse (!) 132, temperature 97.7 F (36.5 C), temperature source Oral, resp. rate 18, height 5\' 6"  (1.676 m), weight (!) 362 lb (164.2 kg), last menstrual period 05/16/2016, SpO2 99 %, unknown if currently breastfeeding. Exam Physical Exam  Physical Exam  Vitals reviewed. Constitutional: She is oriented to person, place, and time. She appears well-developed and well-nourished.  HENT:  Head: Normocephalic and atraumatic.  Right Ear: External ear normal.  Left Ear: External ear normal.  Nose: Nose normal.  Mouth/Throat: Oropharynx is clear and moist.  Eyes: Conjunctivae and EOM are normal. Pupils are equal, round, and reactive to light. Right eye exhibits no discharge. Left eye exhibits no discharge. No scleral icterus.  Neck: Normal range of motion. Neck supple. No tracheal deviation present. No thyromegaly present.  Cardiovascular: Normal rate, regular rhythm, normal heart sounds and intact distal pulses.  Exam reveals no gallop and no friction rub.   No murmur heard. Respiratory: Effort normal and breath sounds normal. No respiratory distress. She has no wheezes. She has no rales. She exhibits no tenderness.  GI: Soft. Bowel sounds are normal. She exhibits no distension and no mass. There is tenderness. There is no rebound and no guarding.  Genitourinary:       Vulva is normal without lesions Vagina is pink moist without discharge Cervix normal in appearance and pap is normal Uterus is term pregnancy Adnexa is negative with normal sized ovaries by sonogram  Musculoskeletal: Normal range of motion. She exhibits no edema and no tenderness.  Neurological: She is alert and oriented to person, place, and time. She has normal reflexes. She displays normal reflexes. No cranial nerve deficit. She exhibits normal muscle tone. Coordination normal.  Skin: Skin is warm and dry. No rash noted. No erythema. No pallor.  Psychiatric: She has a normal  mood and affect. Her behavior is normal. Judgment and thought content normal.    Prenatal labs: ABO, Rh: --/--/O POS, O POS (08/10 1140) Antibody: NEG (08/10 1140) Rubella: 1.75 (01/23 1222) RPR: Non Reactive (08/10 1140)  HBsAg: Negative (01/23 1222)  HIV:  negative  GBS:     Assessment/Plan: 2765w0d Estimated Date of Delivery: 02/20/17  Previous Caesarean section x2, declines Trial of labor Morbid obesity  Repeat Caesarean section   Tina Grant H 02/13/2017, 8:36 AM

## 2017-02-14 ENCOUNTER — Encounter (HOSPITAL_COMMUNITY): Payer: Self-pay

## 2017-02-14 LAB — CBC
HEMATOCRIT: 24.7 % — AB (ref 36.0–46.0)
HEMOGLOBIN: 7.8 g/dL — AB (ref 12.0–15.0)
MCH: 20.6 pg — ABNORMAL LOW (ref 26.0–34.0)
MCHC: 31.6 g/dL (ref 30.0–36.0)
MCV: 65.3 fL — ABNORMAL LOW (ref 78.0–100.0)
Platelets: 229 10*3/uL (ref 150–400)
RBC: 3.78 MIL/uL — AB (ref 3.87–5.11)
RDW: 18.3 % — ABNORMAL HIGH (ref 11.5–15.5)
WBC: 7.2 10*3/uL (ref 4.0–10.5)

## 2017-02-14 MED ORDER — IBUPROFEN 600 MG PO TABS: 600.0000 mg | ORAL_TABLET | Freq: Four times a day (QID) | ORAL | 0 refills | Status: DC

## 2017-02-14 NOTE — Progress Notes (Signed)
RN assessed pt's dressing and found that it was wet and coming off. RN called Dr. Talbert ForestShirley and obtained an order for a dressing change. RN changed dressing. Dressing now clean, dry. Intact with no drainage noted.  Will continue to monitor.

## 2017-02-14 NOTE — Progress Notes (Signed)
Post Partum Day #1 Subjective: no complaints, up ad lib, voiding and tolerating PO, reports normal lochia  Objective: Blood pressure (!) 111/53, pulse (!) 106, temperature 98.7 F (37.1 C), resp. rate 18, height 5\' 6"  (1.676 m), weight (!) 164.2 kg (362 lb), last menstrual period 05/16/2016, SpO2 100 %, unknown if currently breastfeeding.  Physical Exam:  General: alert Lochia: appropriate Uterine Fundus: firm and NT at U-1 Dressing: c/d/i DVT Evaluation: No evidence of DVT seen on physical exam.   Recent Labs  02/11/17 1140 02/14/17 0501  HGB 14.9 7.8*  HCT 45.6 24.7*    Assessment/Plan: Anemia but clinically stable Recheck hbg tomorrow If she desires, possible discharge home tomorrow.   LOS: 1 day   Tina Grant 02/14/2017, 7:20 AM

## 2017-02-14 NOTE — Discharge Instructions (Signed)
Anemia, Nonspecific °Anemia is a condition in which the concentration of red blood cells or hemoglobin in the blood is below normal. Hemoglobin is a substance in red blood cells that carries oxygen to the tissues of the body. Anemia results in not enough oxygen reaching these tissues. °What are the causes? °Common causes of anemia include: °· Excessive bleeding. Bleeding may be internal or external. This includes excessive bleeding from periods (in women) or from the intestine. °· Poor nutrition. °· Chronic kidney, thyroid, and liver disease. °· Bone marrow disorders that decrease red blood cell production. °· Cancer and treatments for cancer. °· HIV, AIDS, and their treatments. °· Spleen problems that increase red blood cell destruction. °· Blood disorders. °· Excess destruction of red blood cells due to infection, medicines, and autoimmune disorders. °What are the signs or symptoms? °· Minor weakness. °· Dizziness. °· Headache. °· Palpitations. °· Shortness of breath, especially with exercise. °· Paleness. °· Cold sensitivity. °· Indigestion. °· Nausea. °· Difficulty sleeping. °· Difficulty concentrating. °Symptoms may occur suddenly or they may develop slowly. °How is this diagnosed? °Additional blood tests are often needed. These help your health care provider determine the best treatment. Your health care provider will check your stool for blood and look for other causes of blood loss. °How is this treated? °Treatment varies depending on the cause of the anemia. Treatment can include: °· Supplements of iron, vitamin B12, or folic acid. °· Hormone medicines. °· A blood transfusion. This may be needed if blood loss is severe. °· Hospitalization. This may be needed if there is significant continual blood loss. °· Dietary changes. °· Spleen removal. °Follow these instructions at home: °Keep all follow-up appointments. It often takes many weeks to correct anemia, and having your health care provider check on your  condition and your response to treatment is very important. °Get help right away if: °· You develop extreme weakness, shortness of breath, or chest pain. °· You become dizzy or have trouble concentrating. °· You develop heavy vaginal bleeding. °· You develop a rash. °· You have bloody or black, tarry stools. °· You faint. °· You vomit up blood. °· You vomit repeatedly. °· You have abdominal pain. °· You have a fever or persistent symptoms for more than 2-3 days. °· You have a fever and your symptoms suddenly get worse. °· You are dehydrated. °This information is not intended to replace advice given to you by your health care provider. Make sure you discuss any questions you have with your health care provider. °Document Released: 07/29/2004 Document Revised: 12/03/2015 Document Reviewed: 12/15/2012 °Elsevier Interactive Patient Education © 2017 [employer] ° °Cesarean Delivery, Care After °Refer to this sheet in the next few weeks. These instructions provide you with information about caring for yourself after your procedure. Your health care provider may also give you more specific instructions. Your treatment has been planned according to current medical practices, but problems sometimes occur. Call your health care provider if you have any problems or questions after your procedure. °What can I expect after the procedure? °After the procedure, it is common to have: °· A small amount of blood or clear fluid coming from the incision. °· Some redness, swelling, and pain in your incision area. °· Some abdominal pain and soreness. °· Vaginal bleeding (lochia). °· Pelvic cramps. °· Fatigue. °Follow these instructions at home: °Incision care  ° °· Follow instructions from your health care provider about how to take care of your incision. Make sure you: °¨ Wash   your hands with soap and water before you change your bandage (dressing). If soap and water are not available, use hand sanitizer. °¨ Change your dressing as  told by your health care provider. °¨ Leave stitches (sutures), skin staples, skin glue, or adhesive strips in place. These skin closures may need to stay in place for 2 weeks or longer. If adhesive strip edges start to loosen and curl up, you may trim the loose edges. Do not remove adhesive strips completely unless your health care provider tells you to do that. °· Check your incision area every day for signs of infection. Check for: °¨ More redness, swelling, or pain. °¨ More fluid or blood. °¨ Warmth. °¨ Pus or a bad smell. °· When you cough or sneeze, hug a pillow. This helps with pain and decreases the chance of your incision opening up (dehiscing). Do this until your incision heals. °Medicines  °· Take over-the-counter and prescription medicines only as told by your health care provider. °· If you were prescribed an antibiotic medicine, take it as told by your health care provider. Do not stop taking the antibiotic until it is finished. °Driving  °· Do not drive or operate heavy machinery while taking prescription pain medicine. °· Do not drive for 24 hours if you received a sedative. °Lifestyle  °· Do not drink alcohol. This is especially important if you are breastfeeding or taking pain medicine. °· Do not use tobacco products, including cigarettes, chewing tobacco, or e-cigarettes. If you need help quitting, ask your health care provider. Tobacco can delay wound healing. °Eating and drinking  °· Drink at least 8 eight-ounce glasses of water every day unless told not to by your health care provider. If you breastfeed, you may need to drink more water than this. °· Eat high-fiber foods every day. These foods may help prevent or relieve constipation. High-fiber foods include: °¨ Whole grain cereals and breads. °¨ Brown rice. °¨ Beans. °¨ Fresh fruits and vegetables. °Activity  °· Return to your normal activities as told by your health care provider. Ask your health care provider what activities are safe for  you. °· Rest as much as possible. Try to rest or take a nap while your baby is sleeping. °· Do not lift anything that is heavier than your baby or 10 lb (4.5 kg) as told by your health care provider. °· Ask your health care provider when you can engage in sexual activity. This may depend on your: °¨ Risk of infection. °¨ Healing rate. °¨ Comfort and desire to engage in sexual activity. °Bathing  °· Do not take baths, swim, or use a hot tub until your health care provider approves. Ask your health care provider if you can take showers. You may only be allowed to take sponge baths until your incision heals. °· Keep your dressing dry as told by your health care provider. °General instructions  °· Do not use tampons or douches until your health care provider approves. °· Wear: °¨ Loose, comfortable clothing. °¨ A supportive and well-fitting bra. °· Watch for any blood clots that may pass from your vagina. These may look like clumps of dark red, brown, or black discharge. °· Keep your perineum clean and dry as told by your health care provider. °· Wipe from front to back when you use the toilet. °· If possible, have someone help you care for your baby and help with household activities for a few days after you leave the hospital. °· Keep all   follow-up visits for you and your baby as told by your health care provider. This is important. °Contact a health care provider if: °· You have: °¨ Bad-smelling vaginal discharge. °¨ Difficulty urinating. °¨ Pain when urinating. °¨ A sudden increase or decrease in the frequency of your bowel movements. °¨ More redness, swelling, or pain around your incision. °¨ More fluid or blood coming from your incision. °¨ Pus or a bad smell coming from your incision. °¨ A fever. °¨ A rash. °¨ Little or no interest in activities you used to enjoy. °¨ Questions about caring for yourself or your baby. °¨ Nausea. °· Your incision feels warm to the touch. °· Your breasts turn red or become painful or  hard. °· You feel unusually sad or worried. °· You vomit. °· You pass large blood clots from your vagina. If you pass a blood clot, save it to show to your health care provider. Do not flush blood clots down the toilet without showing your health care provider. °· You urinate more than usual. °· You are dizzy or light-headed. °· You have not breastfed and have not had a menstrual period for 12 weeks after delivery. °· You stopped breastfeeding and have not had a menstrual period for 12 weeks after stopping breastfeeding. °Get help right away if: °· You have: °¨ Pain that does not go away or get better with medicine. °¨ Chest pain. °¨ Difficulty breathing. °¨ Blurred vision or spots in your vision. °¨ Thoughts about hurting yourself or your baby. °¨ New pain in your abdomen or in one of your legs. °¨ A severe headache. °· You faint. °· You bleed from your vagina so much that you fill two sanitary pads in one hour. °This information is not intended to replace advice given to you by your health care provider. Make sure you discuss any questions you have with your health care provider. °Document Released: 03/13/2002 Document Revised: 10/30/2015 Document Reviewed: 05/26/2015 °Elsevier Interactive Patient Education © 2017 [employer] ° °

## 2017-02-14 NOTE — Progress Notes (Signed)
Pt is G3P3 delivered at 39 weeks by RCS x3.  H/o GDMA on glyburide, asthma controlled, and anemia .  No current complications or needs at this time.  Hemoglobin result discussed with MD this am.  CBC to be redrawn tomorrow am to assess trend.  No further orders at this time.

## 2017-02-15 ENCOUNTER — Encounter: Payer: Self-pay | Admitting: *Deleted

## 2017-02-15 LAB — CBC
HCT: 25.7 % — ABNORMAL LOW (ref 36.0–46.0)
HEMOGLOBIN: 8.4 g/dL — AB (ref 12.0–15.0)
MCH: 21.4 pg — AB (ref 26.0–34.0)
MCHC: 32.7 g/dL (ref 30.0–36.0)
MCV: 65.6 fL — AB (ref 78.0–100.0)
Platelets: 234 10*3/uL (ref 150–400)
RBC: 3.92 MIL/uL (ref 3.87–5.11)
RDW: 18.4 % — ABNORMAL HIGH (ref 11.5–15.5)
WBC: 7.6 10*3/uL (ref 4.0–10.5)

## 2017-02-15 MED ORDER — OXYCODONE-ACETAMINOPHEN 5-325 MG PO TABS: 1.0000 | ORAL_TABLET | Freq: Four times a day (QID) | ORAL | 0 refills | Status: DC | PRN

## 2017-02-15 NOTE — Discharge Summary (Signed)
OB Discharge Summary     Patient Name: Tina Grant DOB: March 08, 1991 MRN: [id number]  Date of admission: 02/13/2017 Delivering MD: Duane Lope H   Date of discharge: 02/15/2017  Admitting diagnosis: RCS Intrauterine pregnancy: [redacted]w[redacted]d     Secondary diagnosis:  Active Problems:   S/P cesarean section  Additional problems: GDMA2     Discharge diagnosis: Term Pregnancy Delivered and GDM A2                                   Hospital course:  Sceduled C/S   26 y.o. yo G3P1003 at [redacted]w[redacted]d was admitted to the hospital 02/13/2017 for scheduled cesarean section with the following indication:Prior Uterine Surgery.  Membrane Rupture Time/Date: 9:23 AM ,02/13/2017   Patient delivered a Viable infant.02/13/2017  Details of operation can be found in separate operative note.  Pateint had an uncomplicated postpartum course.  She is ambulating, tolerating a regular diet, passing flatus, and urinating well. Patient is discharged home in stable condition on  02/15/17         Physical exam  Vitals:   02/14/17 0645 02/14/17 1157 02/14/17 1838 02/15/17 0500  BP: (!) 111/53 (!) 103/56 119/88 123/70  Pulse: (!) 106 (!) 106 (!) 101 79  Resp: 18 19 18 18   Temp: 98.7 F (37.1 C) 99 F (37.2 C) 98.3 F (36.8 C) 98.4 F (36.9 C)  TempSrc:  Oral Oral   SpO2:      Weight:      Height:       General: alert, cooperative and no distress Lochia: appropriate Uterine Fundus: firm Incision: Dressing is clean, dry, and intact DVT Evaluation: No evidence of DVT seen on physical exam. Negative Homan's sign. Labs: Lab Results  Component Value Date   WBC 7.6 02/15/2017   HGB 8.4 (L) 02/15/2017   HCT 25.7 (L) 02/15/2017   MCV 65.6 (L) 02/15/2017   PLT 234 02/15/2017   No flowsheet data found.  Discharge instruction: per After Visit Summary and "Baby and Me Booklet".  After visit meds:  Allergies as of 02/15/2017      Reactions   Shellfish Allergy Anaphylaxis, Hives      Medication List    STOP taking  these medications   Blood Glucose Monitoring Suppl Devi   glucose blood test strip Commonly known as:  ACCU-CHEK GUIDE   glyBURIDE 2.5 MG tablet Commonly known as:  DIABETA     TAKE these medications   acetaminophen 500 MG tablet Commonly known as:  TYLENOL Take 1,000 mg by mouth every 6 (six) hours as needed for mild pain or headache.   albuterol (2.5 MG/3ML) 0.083% nebulizer solution Commonly known as:  PROVENTIL Take 2.5 mg by nebulization every 4 (four) hours as needed for wheezing or shortness of breath.   PROAIR HFA 108 (90 Base) MCG/ACT inhaler Generic drug:  albuterol Inhale 2 puffs into the lungs every 4 (four) hours as needed for wheezing or shortness of breath.   CITRANATAL BLOOM 90-1 MG Tabs Take 1 tablet by mouth daily.   diphenhydrAMINE 25 mg capsule Commonly known as:  BENADRYL Take 25 mg by mouth at bedtime as needed for sleep.   ibuprofen 600 MG tablet Commonly known as:  ADVIL,MOTRIN Take 1 tablet (600 mg total) by mouth every 6 (six) hours.   oxyCODONE-acetaminophen 5-325 MG tablet Commonly known as:  PERCOCET/ROXICET Take 1-2 tablets by mouth every 6 (six)  hours as needed.   ranitidine 150 MG tablet Commonly known as:  ZANTAC Take 150 mg by mouth daily as needed for heartburn.       Diet: routine diet  Activity: Advance as tolerated. Pelvic rest for 6 weeks.   Outpatient follow up: She will be called for staple removal later this week Follow up Appt:Future Appointments Date Time Provider Department Center  03/10/2017 9:00 AM Hermina StaggersErvin, Michael L, MD [facility] None   Follow up Visit:No Follow-up on file.  Postpartum contraception: IUD Mirena  Newborn Data: Live born female  Birth Weight: 10 lb 8.1 oz (4765 g) APGAR: 8, 9  Baby Feeding: Breast Disposition:home with mother   02/15/2017 Catalina AntiguaPeggy Sherrin Stahle, MD

## 2017-02-18 ENCOUNTER — Encounter: Payer: Self-pay | Admitting: Obstetrics

## 2017-02-18 ENCOUNTER — Ambulatory Visit (INDEPENDENT_AMBULATORY_CARE_PROVIDER_SITE_OTHER): Payer: BLUE CROSS/BLUE SHIELD | Admitting: Obstetrics

## 2017-02-18 DIAGNOSIS — Z4802 Encounter for removal of sutures: Secondary | ICD-10-CM

## 2017-02-18 NOTE — Progress Notes (Signed)
Subjective:     Tina Grant is a 26 y.o. female who presents for a postpartum visit. She is 1 week postpartum following a low cervical transverse Cesarean section. I have fully reviewed the prenatal and intrapartum course. The delivery was at 39 gestational weeks. Outcome: repeat cesarean section, low transverse incision. Anesthesia: spinal. Postpartum course has been normal. Baby's course has been normal. Baby is feeding by breast. Bleeding thin lochia. Bowel function is normal. Bladder function is normal. Patient is not sexually active. Contraception method is abstinence. Postpartum depression screening: negative.  Tobacco, alcohol and substance abuse history reviewed.  Adult immunizations reviewed including TDAP, rubella and varicella.  The following portions of the patient's history were reviewed and updated as appropriate: allergies, current medications, past family history, past medical history, past social history, past surgical history and problem list.  Review of Systems A comprehensive review of systems was negative.   Objective:    BP 120/82   Pulse 98   Ht 5\' 6"  (1.676 m)   Wt (!) 364 lb 6.4 oz (165.3 kg)   Breastfeeding? No   BMI 58.82 kg/m    PE:          General:  Alert and no distress          Heart:  RRR          Lungs:  Clear          Breasts:  Soft, non tender          Abdomen:  Obese, soft, non tender.  Incision C, D, I.  Staples removed and steri-strips applied .  50% of 15 min visit spent on counseling and coordination of care.    Assessment:     1. Postpartum care following cesarean delivery - doing well  2. Morbid obesity (HCC) - recommended weight loss program of caloric reduction and exercise  3. Encounter for removal of staples - staples removed and steri-strips applied  Plan:    1. Contraception: abstinence 2. Continue PNV's 3. Follow up in: 2 weeks or as needed.   Healthy lifestyle practices reviewed

## 2017-02-18 NOTE — Progress Notes (Signed)
Pt is in the office for staple removal, c-section on 02-13-17.

## 2017-02-22 ENCOUNTER — Telehealth: Payer: Self-pay

## 2017-02-22 NOTE — Telephone Encounter (Signed)
Appointment made to have BP check 02/24/17

## 2017-02-24 ENCOUNTER — Ambulatory Visit: Payer: BLUE CROSS/BLUE SHIELD

## 2017-02-24 VITALS — BP 124/85 | HR 87 | Ht 66.0 in | Wt 358.6 lb

## 2017-02-24 DIAGNOSIS — Z0130 Encounter for examination of blood pressure without abnormal findings: Secondary | ICD-10-CM

## 2017-02-24 NOTE — Progress Notes (Signed)
Patient stated that home health nurse came by and she had a BP of 140/90 and a headcahe, pt denies symptoms today, notified provider.    Subjective:  Tina Grant is a 26 y.o. female with hypertension. Current Outpatient Prescriptions  Medication Sig Dispense Refill  . acetaminophen (TYLENOL) 500 MG tablet Take 1,000 mg by mouth every 6 (six) hours as needed for mild pain or headache.    . albuterol (PROVENTIL) (2.5 MG/3ML) 0.083% nebulizer solution Take 2.5 mg by nebulization every 4 (four) hours as needed for wheezing or shortness of breath.   11  . PROAIR HFA 108 (90 Base) MCG/ACT inhaler Inhale 2 puffs into the lungs every 4 (four) hours as needed for wheezing or shortness of breath. 18 g PRN  . ranitidine (ZANTAC) 150 MG tablet Take 150 mg by mouth daily as needed for heartburn.     . diphenhydrAMINE (BENADRYL) 25 mg capsule Take 25 mg by mouth at bedtime as needed for sleep.    [name] ibuprofen (ADVIL,MOTRIN) 600 MG tablet Take 1 tablet (600 mg total) by mouth every 6 (six) hours. (Patient not taking: Reported on 02/18/2017) 30 tablet 0  . oxyCODONE-acetaminophen (PERCOCET/ROXICET) 5-325 MG tablet Take 1-2 tablets by mouth every 6 (six) hours as needed. (Patient not taking: Reported on 02/18/2017) 30 tablet 0  . Prenatal-DSS-FeCb-FeGl-FA (CITRANATAL BLOOM) 90-1 MG TABS Take 1 tablet by mouth daily. (Patient not taking: Reported on 02/18/2017) 30 tablet 12   No current facility-administered medications for this visit.     Hypertension ROS: taking medications as instructed, no medication side effects noted, no TIA's, no chest pain on exertion, no dyspnea on exertion and no swelling of ankles.  New concerns: n/a.   Objective:  BP 124/85   Pulse 87   Ht 5\' 6"  (1.676 m)   Wt (!) 358 lb 9.6 oz (162.7 kg)   Breastfeeding? No   BMI 57.88 kg/m   Appearance alert, well appearing, and in no distress. General exam BP noted to be well controlled today in office.    Assessment:   Hypertension well  controlled.   Plan:  Current treatment plan is effective, no change in therapy.[name]

## 2017-03-04 ENCOUNTER — Ambulatory Visit: Payer: BLUE CROSS/BLUE SHIELD | Admitting: Obstetrics

## 2017-03-10 ENCOUNTER — Ambulatory Visit: Payer: BLUE CROSS/BLUE SHIELD | Admitting: Obstetrics and Gynecology

## 2017-03-16 ENCOUNTER — Telehealth: Payer: Self-pay | Admitting: *Deleted

## 2017-03-16 ENCOUNTER — Other Ambulatory Visit: Payer: Self-pay | Admitting: Certified Nurse Midwife

## 2017-03-16 NOTE — Telephone Encounter (Signed)
Do not refill, needs 2 hour PP glucola at 6 weeks.  Please make sure that she is scheduled.  Thank you.  Danne Scardina

## 2017-03-16 NOTE — Telephone Encounter (Signed)
Fax received from pharmacy for refill of Glyburide 2.5mg .  Pt has PP appt to see Rachelle on 03/21/17.    Please send refill if approved.

## 2017-03-17 NOTE — Telephone Encounter (Signed)
LM on VM to call office.

## 2017-03-21 ENCOUNTER — Encounter: Payer: Self-pay | Admitting: Certified Nurse Midwife

## 2017-03-21 ENCOUNTER — Encounter: Payer: Self-pay | Admitting: *Deleted

## 2017-03-21 ENCOUNTER — Ambulatory Visit (INDEPENDENT_AMBULATORY_CARE_PROVIDER_SITE_OTHER): Payer: BLUE CROSS/BLUE SHIELD | Admitting: Certified Nurse Midwife

## 2017-03-21 VITALS — BP 128/90 | HR 109 | Wt 322.0 lb

## 2017-03-21 DIAGNOSIS — Z98891 History of uterine scar from previous surgery: Secondary | ICD-10-CM

## 2017-03-21 DIAGNOSIS — Z3202 Encounter for pregnancy test, result negative: Secondary | ICD-10-CM | POA: Diagnosis not present

## 2017-03-21 DIAGNOSIS — Z3049 Encounter for surveillance of other contraceptives: Secondary | ICD-10-CM | POA: Diagnosis not present

## 2017-03-21 DIAGNOSIS — Z30017 Encounter for initial prescription of implantable subdermal contraceptive: Secondary | ICD-10-CM

## 2017-03-21 DIAGNOSIS — Z1389 Encounter for screening for other disorder: Secondary | ICD-10-CM | POA: Diagnosis not present

## 2017-03-21 DIAGNOSIS — Z538 Procedure and treatment not carried out for other reasons: Secondary | ICD-10-CM

## 2017-03-21 DIAGNOSIS — N852 Hypertrophy of uterus: Secondary | ICD-10-CM

## 2017-03-21 LAB — POCT URINE PREGNANCY: Preg Test, Ur: NEGATIVE

## 2017-03-21 MED ORDER — ETONOGESTREL 68 MG ~~LOC~~ IMPL
68.0000 mg | DRUG_IMPLANT | Freq: Once | SUBCUTANEOUS | Status: AC
  Administered 2017-03-21: 68 mg via SUBCUTANEOUS

## 2017-03-21 NOTE — Progress Notes (Signed)
Post Partum Exam  Tina Grant is a 26 y.o. G44P1003 female who presents for a postpartum visit. She is 6 weeks postpartum following a low cervical transverse Cesarean section. I have fully reviewed the prenatal and intrapartum course. The delivery was at 38 gestational weeks.  Anesthesia: spinal. Postpartum course has been doing well. Baby's course has been doing well. Baby is feeding by bottle - Similac Advance. Bleeding no bleeding. Bowel function is normal. Bladder function is normal. Patient is not sexually active. Contraception method is abstinence. Interested in IUD.  Postpartum depression screening:neg, score 0.   The following portions of the patient's history were reviewed and updated as appropriate: allergies, current medications, past family history, past medical history, past social history, past surgical history and problem list.  Review of Systems Pertinent items noted in HPI and remainder of comprehensive ROS otherwise negative.    Objective:  not currently breastfeeding.  General:  alert, cooperative and no distress   Breasts:  inspection negative, no nipple discharge or bleeding, no masses or nodularity palpable  Lungs: clear to auscultation bilaterally  Heart:  regular rate and rhythm, S1, S2 normal, no murmur, click, rub or gallop  Abdomen: soft, non-tender; bowel sounds normal; no masses,  no organomegaly   Vulva:  normal  Vagina: normal vagina, no discharge, exudate, lesion, or erythema  Cervix:  no lesions  Corpus: not examined  Adnexa:  not evaluated  Rectal Exam: Not performed.       Pap smear: 07/27/16: normal Assessment:    Normal 6 week postpartum exam. Pap smear not done at today's visit.   Contraception counseling  IUD attempted: enlarged uterus unable to place device.   Plan:   1. Contraception: abstinence and Nexplanon today 2. F/u annual exam 08/2017 3. Follow up in: 5 months or as needed.

## 2017-03-21 NOTE — Progress Notes (Signed)
IUD Procedure Note   DIAGNOSIS: Desires long-term, reversible contraception   PROCEDURE: IUD placement Performing Provider: Orvilla Cornwall CNM  Patient counseled prior to procedure. I explained risks and benefits of Mirena IUD, reviewed alternative forms of contraception. Patient stated understanding and consented to continue with procedure.   LMP: unknown, postpartum Pregnancy Test: Negative Device not placed.  Uterus sounded to 18-20 cm  Discussed results of sounding with Dr. Jolayne Panther.  [place] ordered.    PROCEDURE:  Timeout procedure was performed to ensure right patient and right site.  A bimanual exam was performed to determine the position of the uterus, retroverted. The speculum was placed. The vagina and cervix was sterilized in the usual manner and sterile technique was maintained throughout the course of the procedure. A single toothed tenaculum was applied to the posterior lip of the cervix and gentle traction applied. The depth of the uterus was sounded to 18-20 cm. Due to depth of uterus device not placed after counseling performed with patient.      Orvilla Cornwall CNM

## 2017-03-21 NOTE — Progress Notes (Signed)
Nexplanon Procedure Note   PRE-OP DIAGNOSIS: desired long-term, reversible contraception  POST-OP DIAGNOSIS: Same  PROCEDURE: Nexplanon  placement Performing Provider: Orvilla Cornwall CNM   Patient education prior to procedure, explained risk, benefits of Nexplanon, reviewed alternative options. Patient reported understanding. Gave consent to continue with procedure.   PROCEDURE:  Pregnancy Text :  Negative Site (check):      right arm         Sterile Preparation:   Hibiclens-Alcoholx3 Lot # [id number]  [id number] Expiration Date 07/2019  Insertion site was selected 8 - 10 cm from medial epicondyle and marked along with guiding site using sterile marker. Procedure area was prepped and draped in a sterile fashion. 1% Lidocaine 1.5 ml given prior to procedure. Nexplanon  was inserted subcutaneously.Needle was removed from the insertion site. Nexplanon capsule was palpated by provider and patient to assure satisfactory placement. And a bandage applied and the arm was wrapped with gauze bandage.     Followup: The patient tolerated the procedure well without complications.  Instructions:  The patient was instructed to remove the dressing in 24 hours and that some bruising is to be expected.  She was advised to use over the counter analgesics as needed for any pain at the site.  She is to keep the area dry for 24 hours and to call if her hand or arm becomes cold, numb, or blue.   Orvilla Cornwall CNM

## 2017-03-24 ENCOUNTER — Other Ambulatory Visit: Payer: BLUE CROSS/BLUE SHIELD

## 2017-03-29 ENCOUNTER — Ambulatory Visit (HOSPITAL_COMMUNITY): Payer: BLUE CROSS/BLUE SHIELD | Attending: Certified Nurse Midwife

## 2017-09-30 ENCOUNTER — Ambulatory Visit (INDEPENDENT_AMBULATORY_CARE_PROVIDER_SITE_OTHER): Payer: Medicaid Other

## 2017-09-30 ENCOUNTER — Ambulatory Visit (HOSPITAL_COMMUNITY)
Admission: EM | Admit: 2017-09-30 | Discharge: 2017-09-30 | Disposition: A | Discharge status: Home / Self Care | Payer: Medicaid Other | Attending: Family Medicine | Admitting: Family Medicine

## 2017-09-30 ENCOUNTER — Encounter (HOSPITAL_COMMUNITY): Payer: Self-pay | Admitting: Emergency Medicine

## 2017-09-30 DIAGNOSIS — S63501A Unspecified sprain of right wrist, initial encounter: Secondary | ICD-10-CM

## 2017-09-30 NOTE — ED Triage Notes (Signed)
Pt states she lifted someone and felt a crack in her R wrist area. Pt c/o arm pain.

## 2017-09-30 NOTE — Discharge Instructions (Signed)
You have a sprain to the right wrist. Keep splinted for 2 weeks, taking off to ice up to 4x a day for 20 minutes at a time. Elevate. May use Tylenol as directed for pain. If not improving in 2-3 weeks then follow up with Ortho.

## 2017-09-30 NOTE — ED Provider Notes (Signed)
[facility]    CSN: [id number] Arrival date & time: 09/30/17  1826     History   Chief Complaint Chief Complaint  Patient presents with  . Wrist Pain    HPI Tina Grant is a [age] y.o. female.    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 She felt a "pop" in her wrist. Pain since that time. Her pain is along the ulnar side.      Past Medical History:  Diagnosis Date  . Anemia   . Asthma   . Complication of anesthesia   . Gestational diabetes    diet controlled  . Infection    uti  . MRSA infection     Patient Active Problem List   Diagnosis Date Noted  . Nexplanon insertion 03/21/2017  . S/P cesarean section 02/13/2017  . Gestational diabetes mellitus (GDM) in third trimester 12/02/2016  . Maternal iron deficiency anemia affecting pregnancy, antepartum, third trimester 12/02/2016  . History of asthma 12/01/2016  . GBS bacteriuria 08/05/2016  . Supervision of high risk pregnancy, antepartum 07/27/2016  . Morbid obesity (HCC) 07/27/2016  . H/O: C-section 07/27/2016    Past Surgical History:  Procedure Laterality Date  . CESAREAN SECTION    . CESAREAN SECTION N/A 02/11/2014  . CESAREAN SECTION N/A 02/13/2017   Procedure: REPEAT CESAREAN SECTION;  Surgeon: Lazaro ArmsEure, Luther H, MD;  [facility];  Service: Obstetrics;  Laterality: N/A;    OB History    Gravida  3   Para  3   Term  1   Preterm      AB      Living  3     SAB      TAB      Ectopic      Multiple  0   Live Births  3        Obstetric Comments  NRFHR for 1st C-section 2nd C-section for repeat.         Home Medications    Prior to Admission medications   Medication Sig Start Date End Date Taking? Authorizing Provider  Etonogestrel (NEXPLANON [place]) Inject into the skin.   Yes [provider]  acetaminophen (TYLENOL) 500 MG tablet Take 1,000 mg by mouth every 6 (six) hours as needed for mild pain or headache.     [provider]  albuterol (PROVENTIL) (2.5 MG/3ML) 0.083% nebulizer solution Take 2.5 mg by nebulization every 4 (four) hours as needed for wheezing or shortness of breath.  10/16/16   [provider]  diphenhydrAMINE (BENADRYL) 25 mg capsule Take 25 mg by mouth at bedtime as needed for sleep.    [provider]  ibuprofen (ADVIL,MOTRIN) 600 MG tablet Take 1 tablet (600 mg total) by mouth every 6 (six) hours. Patient not taking: Reported on 02/18/2017 02/14/17   Allie Bossierove, Myra C, MD  oxyCODONE-acetaminophen (PERCOCET/ROXICET) 5-325 MG tablet Take 1-2 tablets by mouth every 6 (six) hours as needed. Patient not taking: Reported on 02/18/2017 02/15/17   Constant, Peggy, MD  Prenatal-DSS-FeCb-FeGl-FA (CITRANATAL BLOOM) 90-1 MG TABS Take 1 tablet by mouth daily. Patient not taking: Reported on 02/18/2017 12/02/16   Roe Coombsenney, Rachelle A, CNM  PROAIR HFA 108 [phone](90 Base) MCG/ACT inhaler Inhale 2 puffs into the lungs every 4 (four) hours as needed for wheezing or shortness of breath. 01/12/17   Adam PhenixArnold, James G, MD  ranitidine (ZANTAC) 150 MG tablet Take 150 mg by mouth daily as needed for heartburn.  [provider]    Family History Family History  Problem Relation Age of Onset  . HIV Mother   . Cancer Maternal Grandmother        breast  . Heart disease Maternal Grandfather     Social History Social History   Tobacco Use  . Smoking status: Former Smoker    Types: Cigarettes  . Smokeless tobacco: Never Used  . Tobacco comment: prior to first preg  Substance Use Topics  . Alcohol use: No  . Drug use: No     Allergies   Shellfish allergy   Review of Systems Review of Systems  All other systems reviewed and are negative.    Physical Exam Triage Vital Signs ED Triage Vitals  Enc Vitals Group     BP 09/30/17 1848 129/84     Pulse Rate 09/30/17 1848 (!) 104     Resp 09/30/17 1848 18     Temp 09/30/17 1848 98.5 F (36.9 C)     Temp src --      SpO2  09/30/17 1848 98 %     Weight --      Height --      Head Circumference --      Peak Flow --      Pain Score 09/30/17 1849 10     Pain Loc --      Pain Edu? --      Excl. in GC? --    No data found.  Updated Vital Signs BP 129/84   Pulse (!) 104   Temp 98.5 F (36.9 C)   Resp 18   SpO2 98%   Visual Acuity Right Eye Distance:   Left Eye Distance:   Bilateral Distance:    Right Eye Near:   Left Eye Near:    Bilateral Near:     Physical Exam  Constitutional: She appears well-developed and well-nourished.  Musculoskeletal: She exhibits tenderness. She exhibits no edema or deformity.  Right wrist without visible swelling, no deformity, decreased ROM in the setting of pain, good strength, sensation intact, good cap refill  Neurological: She is alert.  Skin: Skin is warm and dry.  Psychiatric: Her behavior is normal.  Nursing note and vitals reviewed.    UC Treatments / Results  Labs (all labs ordered are listed, but only abnormal results are displayed) Labs Reviewed - No data to display  EKG None Radiology No results found.  Procedures Procedures (including critical care time)  Medications Ordered in UC Medications - No data to display   Initial Impression / Assessment and Plan / UC Course  I have reviewed the triage vital signs and the nursing notes.  Pertinent labs & imaging results that were available during my care of the patient were reviewed by me and considered in my medical decision making (see chart for details).   Splint, ice, elevate and NSAIDs as needed. FU with Orthopedics if not improving    Final Clinical Impressions(s) / UC Diagnoses   Final diagnoses:  None    ED Discharge Orders    None       Controlled Substance Prescriptions [place] Controlled Substance Registry consulted? Not Applicable   Sharin Mons 09/30/17 1610

## 2017-11-17 ENCOUNTER — Encounter (HOSPITAL_COMMUNITY): Payer: Self-pay | Admitting: Emergency Medicine

## 2017-11-17 ENCOUNTER — Other Ambulatory Visit: Payer: Self-pay

## 2017-11-17 ENCOUNTER — Emergency Department (HOSPITAL_COMMUNITY)
Admission: EM | Admit: 2017-11-17 | Discharge: 2017-11-17 | Disposition: A | Discharge status: Home / Self Care | Payer: Medicaid Other | Attending: Emergency Medicine | Admitting: Emergency Medicine

## 2017-11-17 DIAGNOSIS — R51 Headache: Secondary | ICD-10-CM | POA: Insufficient documentation

## 2017-11-17 DIAGNOSIS — J45909 Unspecified asthma, uncomplicated: Secondary | ICD-10-CM | POA: Insufficient documentation

## 2017-11-17 DIAGNOSIS — R000 Tachycardia, unspecified: Secondary | ICD-10-CM | POA: Insufficient documentation

## 2017-11-17 DIAGNOSIS — Z87891 Personal history of nicotine dependence: Secondary | ICD-10-CM | POA: Diagnosis not present

## 2017-11-17 DIAGNOSIS — R42 Dizziness and giddiness: Secondary | ICD-10-CM | POA: Diagnosis not present

## 2017-11-17 LAB — I-STAT BETA HCG BLOOD, ED (MC, WL, AP ONLY)

## 2017-11-17 LAB — CBC
HEMATOCRIT: 35.1 % — AB (ref 36.0–46.0)
Hemoglobin: 11 g/dL — ABNORMAL LOW (ref 12.0–15.0)
MCH: 21.1 pg — ABNORMAL LOW (ref 26.0–34.0)
MCHC: 31.3 g/dL (ref 30.0–36.0)
MCV: 67.4 fL — AB (ref 78.0–100.0)
PLATELETS: 391 10*3/uL (ref 150–400)
RBC: 5.21 MIL/uL — AB (ref 3.87–5.11)
RDW: 19.9 % — ABNORMAL HIGH (ref 11.5–15.5)
WBC: 8.8 10*3/uL (ref 4.0–10.5)

## 2017-11-17 LAB — BASIC METABOLIC PANEL
Anion gap: 8 (ref 5–15)
BUN: 9 mg/dL (ref 6–20)
CHLORIDE: 106 mmol/L (ref 101–111)
CO2: 23 mmol/L (ref 22–32)
CREATININE: 0.67 mg/dL (ref 0.44–1.00)
Calcium: 9.4 mg/dL (ref 8.9–10.3)
GFR calc non Af Amer: >60 mL/min (ref >60)
Glucose, Bld: 99 mg/dL (ref 65–99)
Potassium: 4.1 mmol/L (ref 3.5–5.1)
Sodium: 137 mmol/L (ref 135–145)

## 2017-11-17 LAB — URINALYSIS, ROUTINE W REFLEX MICROSCOPIC
BILIRUBIN URINE: NEGATIVE
Glucose, UA: NEGATIVE mg/dL
KETONES UR: NEGATIVE mg/dL
LEUKOCYTES UA: NEGATIVE
Nitrite: NEGATIVE
PH: 6 (ref 5.0–8.0)
PROTEIN: NEGATIVE mg/dL
Specific Gravity, Urine: 1.012 (ref 1.005–1.030)

## 2017-11-17 MED ORDER — MECLIZINE HCL 25 MG PO TABS: 25.0000 mg | ORAL_TABLET | Freq: Two times a day (BID) | ORAL | 0 refills | Status: DC | PRN

## 2017-11-17 NOTE — ED Provider Notes (Signed)
[facility] EMERGENCY DEPARTMENT Provider Note   CSN: [id number] Arrival date & time: 11/17/17  1552     History   Chief Complaint Chief Complaint  Patient presents with  . Hypertension  . Dizziness    HPI Tina Grant is a 27 y.o. female.  The history is provided by the patient.  Dizziness  Quality:  Lightheadedness Severity:  Mild Onset quality:  Gradual Duration:  1 month Timing:  Intermittent Progression:  Waxing and waning Chronicity:  New Context: eye movement and standing up   Context: not with head movement, not with loss of consciousness and not with physical activity   Relieved by:  None tried Worsened by:  Eye movement Ineffective treatments:  None tried Associated symptoms: headaches   Associated symptoms: no chest pain, no diarrhea, no nausea, no palpitations, no shortness of breath, no syncope, no vomiting and no weakness   Risk factors: no hx of vertigo     Past Medical History:  Diagnosis Date  . Anemia   . Asthma   . Complication of anesthesia   . Gestational diabetes    diet controlled  . Infection    uti  . MRSA infection     Patient Active Problem List   Diagnosis Date Noted  . Nexplanon insertion 03/21/2017  . S/P cesarean section 02/13/2017  . Gestational diabetes mellitus (GDM) in third trimester 12/02/2016  . Maternal iron deficiency anemia affecting pregnancy, antepartum, third trimester 12/02/2016  . History of asthma 12/01/2016  . GBS bacteriuria 08/05/2016  . Supervision of high risk pregnancy, antepartum 07/27/2016  . Morbid obesity (HCC) 07/27/2016  . H/O: C-section 07/27/2016    Past Surgical History:  Procedure Laterality Date  . CESAREAN SECTION    . CESAREAN SECTION N/A 02/11/2014  . CESAREAN SECTION N/A 02/13/2017   Procedure: REPEAT CESAREAN SECTION;  Surgeon: Lazaro Arms, MD;  Location: [facility] BIRTHING SUITES;  Service: Obstetrics;  Laterality: N/A;     OB History    Gravida  3   Para  3     Term  1   Preterm      AB      Living  3     SAB      TAB      Ectopic      Multiple  0   Live Births  3        Obstetric Comments  NRFHR for 1st C-section 2nd C-section for repeat.         Home Medications    Prior to Admission medications   Medication Sig Start Date End Date Taking? Authorizing Provider  acetaminophen (TYLENOL) 500 MG tablet Take 1,000 mg by mouth every 6 (six) hours as needed for mild pain or headache.   Yes [provider]  albuterol (PROVENTIL) (2.5 MG/3ML) 0.083% nebulizer solution Take 2.5 mg by nebulization every 4 (four) hours as needed for wheezing or shortness of breath.  10/16/16  Yes [provider]  diphenhydrAMINE (BENADRYL) 25 mg capsule Take 25 mg by mouth at bedtime as needed for sleep.   Yes [provider]  Etonogestrel (NEXPLANON [place]) Inject into the skin.   Yes [provider]  PROAIR HFA 108 (90 Base) MCG/ACT inhaler Inhale 2 puffs into the lungs every 4 (four) hours as needed for wheezing or shortness of breath. 01/12/17  Yes Adam Phenix, MD  ranitidine (ZANTAC) 150 MG tablet Take 150 mg by mouth daily as needed for heartburn.  Yes [provider]  ibuprofen (ADVIL,MOTRIN) 600 MG tablet Take 1 tablet (600 mg total) by mouth every 6 (six) hours. Patient not taking: Reported on 02/18/2017 02/14/17   Allie Bossier, MD  meclizine (ANTIVERT) 25 MG tablet Take 1 tablet (25 mg total) by mouth 2 (two) times daily as needed for dizziness. 11/17/17   Lennette Bihari, MD  oxyCODONE-acetaminophen (PERCOCET/ROXICET) 5-325 MG tablet Take 1-2 tablets by mouth every 6 (six) hours as needed. Patient not taking: Reported on 02/18/2017 02/15/17   Constant, Peggy, MD  Prenatal-DSS-FeCb-FeGl-FA (CITRANATAL BLOOM) 90-1 MG TABS Take 1 tablet by mouth daily. Patient not taking: Reported on 02/18/2017 12/02/16   Roe Coombs, CNM    Family History Family History  Problem Relation Age of Onset  .  HIV Mother   . Cancer Maternal Grandmother        breast  . Heart disease Maternal Grandfather     Social History Social History   Tobacco Use  . Smoking status: Former Smoker    Types: Cigarettes  . Smokeless tobacco: Never Used  . Tobacco comment: prior to first preg  Substance Use Topics  . Alcohol use: No  . Drug use: No     Allergies   Shellfish allergy and Iodine   Review of Systems Review of Systems  Constitutional: Negative for chills and fever.  HENT: Negative for ear pain and sore throat.   Eyes: Negative for pain and visual disturbance.  Respiratory: Negative for cough and shortness of breath.   Cardiovascular: Negative for chest pain, palpitations and syncope.  Gastrointestinal: Negative for abdominal pain, diarrhea, nausea and vomiting.  Genitourinary: Negative for dysuria and hematuria.  Musculoskeletal: Negative for arthralgias and back pain.  Skin: Negative for color change and rash.  Neurological: Positive for dizziness, light-headedness and headaches. Negative for seizures, syncope and weakness.  All other systems reviewed and are negative.    Physical Exam Updated Vital Signs BP 104/64   Pulse (!) 106   Temp 98.9 F (37.2 C) (Oral)   Resp 20   SpO2 97%   Physical Exam  Constitutional: She is oriented to person, place, and time. She appears well-developed and well-nourished. No distress.  HENT:  Head: Normocephalic and atraumatic.  Eyes: Conjunctivae are normal.  Neck: Neck supple.  Cardiovascular: Regular rhythm.  No murmur heard. Mild tachycardia  Pulmonary/Chest: Effort normal and breath sounds normal. No respiratory distress.  Abdominal: Soft. There is no tenderness.  Musculoskeletal: She exhibits no edema.  Neurological: She is alert and oriented to person, place, and time. She displays normal reflexes. No cranial nerve deficit or sensory deficit. She exhibits normal muscle tone. Coordination normal.  Normal strength, 5/5, in all  extremities. Sensation to light touch intact throughout. Normal cerebellar testing. Normal gait.  Skin: Skin is warm and dry.  Psychiatric: She has a normal mood and affect.  Nursing note and vitals reviewed.    ED Treatments / Results  Labs (all labs ordered are listed, but only abnormal results are displayed) Labs Reviewed  CBC - Abnormal; Notable for the following components:      Result Value   RBC 5.21 (*)    Hemoglobin 11.0 (*)    HCT 35.1 (*)    MCV 67.4 (*)    MCH 21.1 (*)    RDW 19.9 (*)    All other components within normal limits  URINALYSIS, ROUTINE W REFLEX MICROSCOPIC - Abnormal; Notable for the following components:   APPearance HAZY (*)  Hgb urine dipstick MODERATE (*)    Bacteria, UA RARE (*)    All other components within normal limits  BASIC METABOLIC PANEL  I-STAT BETA HCG BLOOD, ED ([facility], [facility], [facility] ONLY)    EKG EKG Interpretation  Date/Time:  Thursday Nov 17 2017 16:04:45 EDT Ventricular Rate:  119 PR Interval:  138 QRS Duration: 82 QT Interval:  330 QTC Calculation: 464 R Axis:   6 Text Interpretation:  Sinus tachycardia Otherwise normal ECG No previous ECGs available Confirmed by Frederick Peers [phone]) on 11/17/2017 7:48:03 PM   Radiology No results found.  Procedures Procedures (including critical care time)  Medications Ordered in ED Medications - No data to display   Initial Impression / Assessment and Plan / ED Course  I have reviewed the triage vital signs and the nursing notes.  Pertinent labs & imaging results that were available during my care of the patient were reviewed by me and considered in my medical decision making (see chart for details).    Patient is a 27 year old female with history as above who presents with dizziness.  She describes her dizziness more of a lightheadedness.  It is worse when she moves her eyes.  It is also worse when she stands up.  She denies any symptoms of dehydration although she does have mild  tachycardia here.  Her symptoms have been going on and off for a month.  Her EKG shows sinus tachycardia with no ischemic changes.  Her hemoglobin is 11, however this is improved from prior.  Other labs are unremarkable.  Her neurologic testing is normal.  She walks with a normal gait.  Her symptoms may be related to vertigo.  She does not have any chest pain or shortness of breath.  We will start her on meclizine and have her follow-up with PCP.  Return precautions were discussed in detail.  Final Clinical Impressions(s) / ED Diagnoses   Final diagnoses:  Dizziness    ED Discharge Orders        Ordered    meclizine (ANTIVERT) 25 MG tablet  2 times daily PRN     11/17/17 2203       Lennette Bihari, MD 11/17/17 2333    Clarene Duke, Ambrose Finland, MD 11/18/17 1341

## 2017-11-17 NOTE — ED Triage Notes (Signed)
Pt c/o dizziness/lightheadedness and slight headache x "a few weeks", checked BP at work and states it was 180s/100. Denies hx hypertension.

## 2018-02-16 ENCOUNTER — Other Ambulatory Visit: Payer: Self-pay | Admitting: Obstetrics & Gynecology

## 2018-02-16 DIAGNOSIS — Z8709 Personal history of other diseases of the respiratory system: Secondary | ICD-10-CM

## 2018-04-01 IMAGING — US US MFM OB FOLLOW-UP
1 series · 14 of 28 positions shown · non-contrast
Comparison: none

[Series 1: us mfm ob follow-up · 44 acquisitions, 14 frames shown]
[im 2/44]
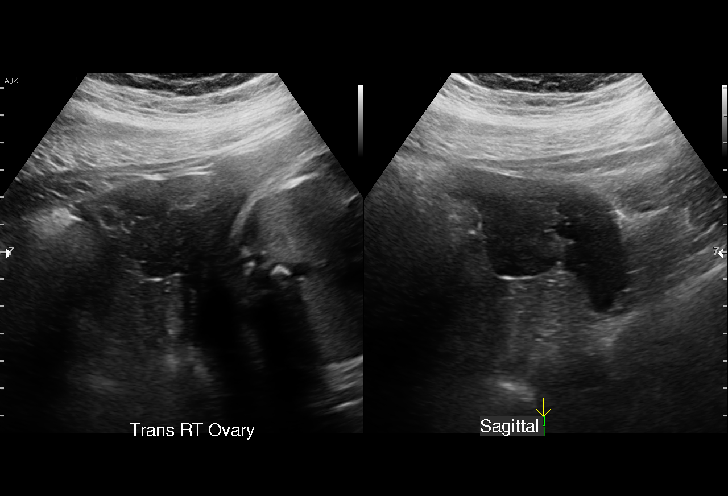
[im 5/44]
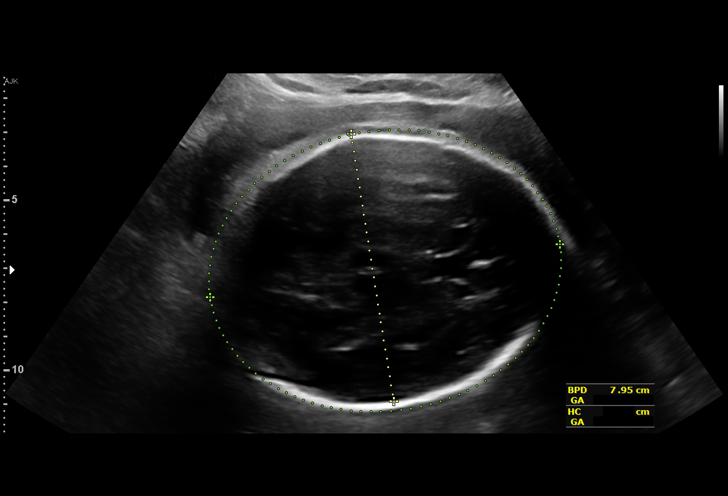
[im 8/44]
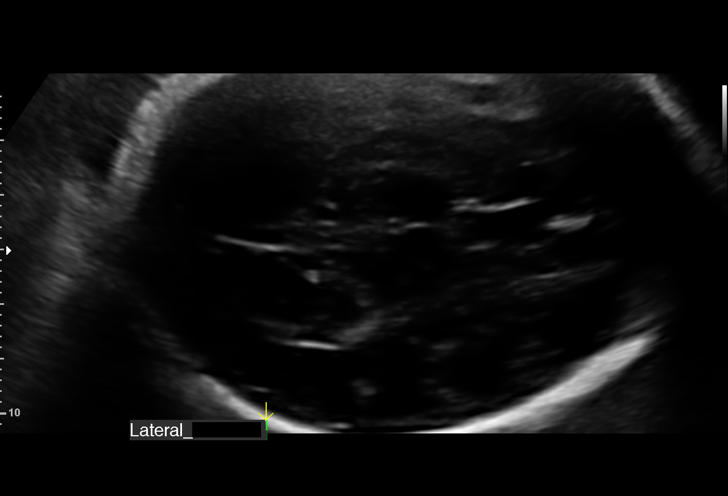
[im 12/44]
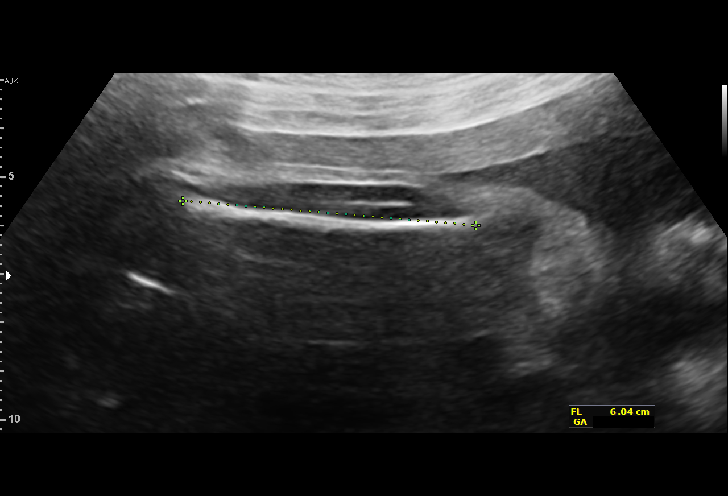
[im 15/44]
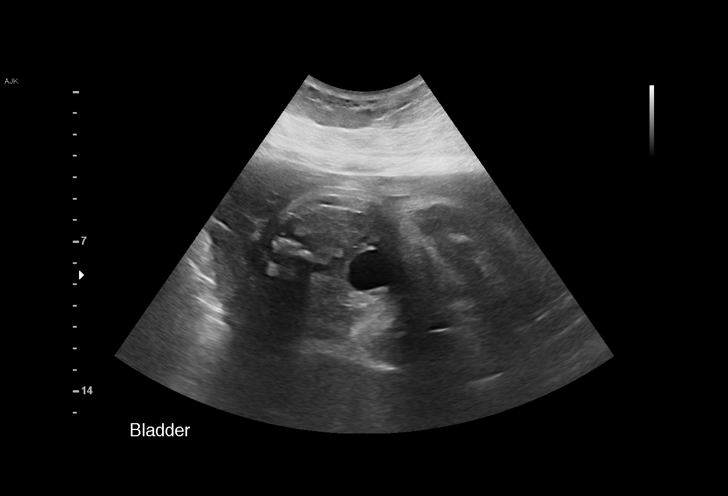
[im 18/44]
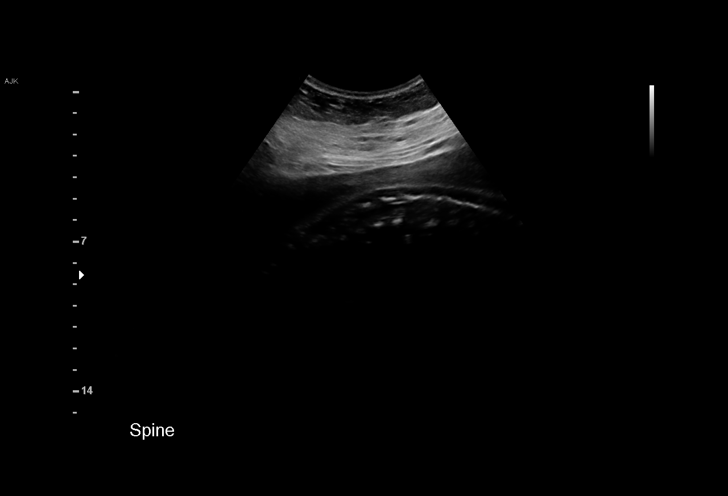
[im 21/44]
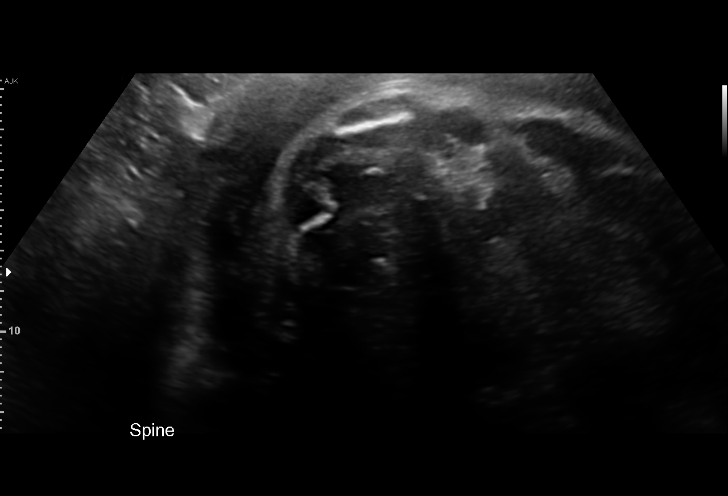
[im 24/44]
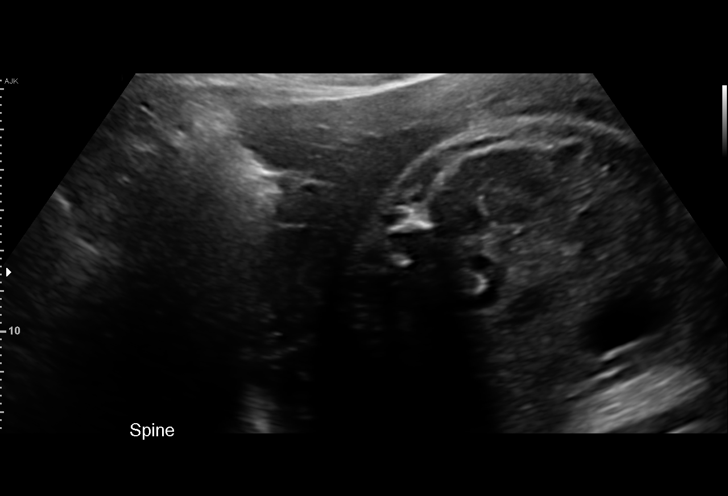
[im 28/44]
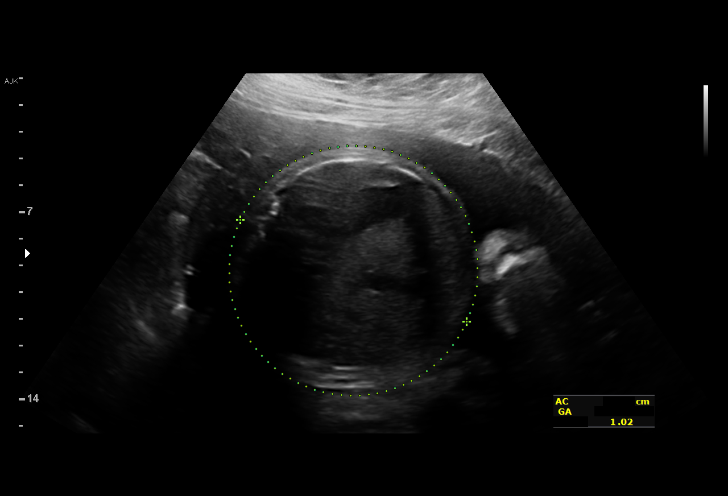
[im 31/44]
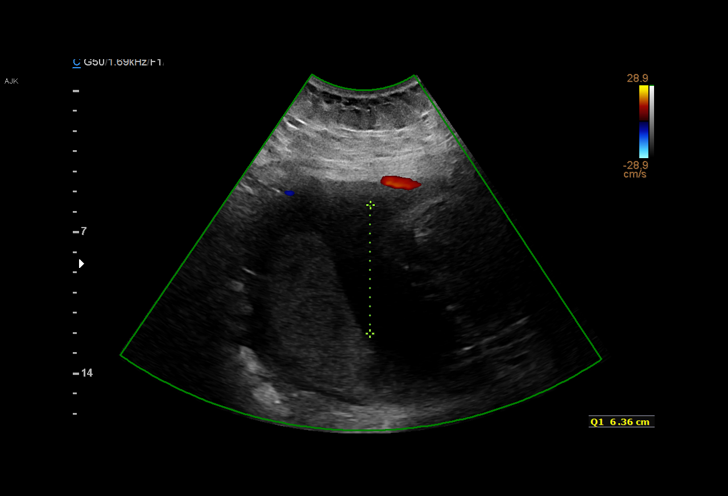
[im 34/44]
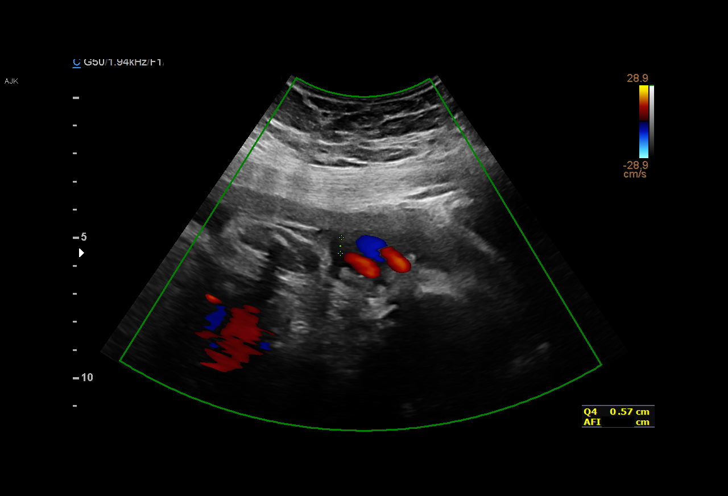
[im 37/44]
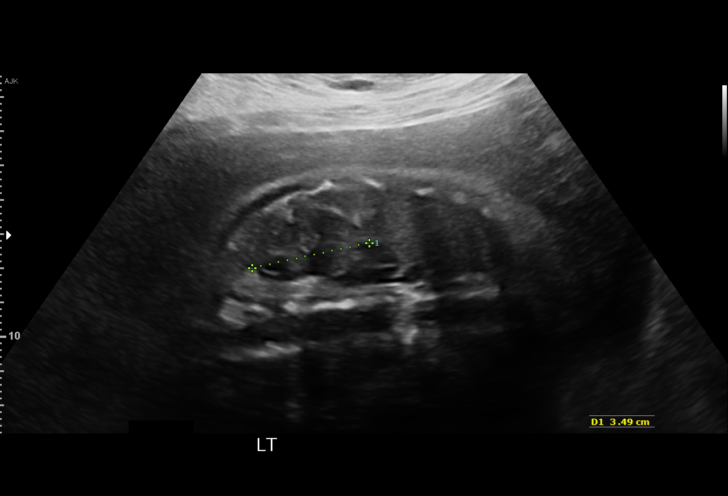
[im 40/44]
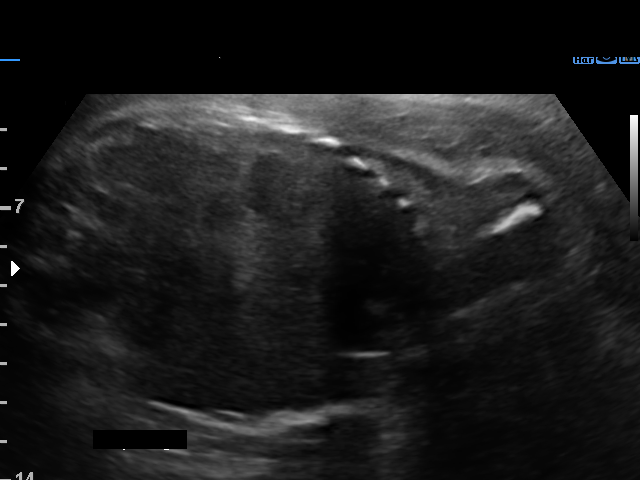
[im 44/44]
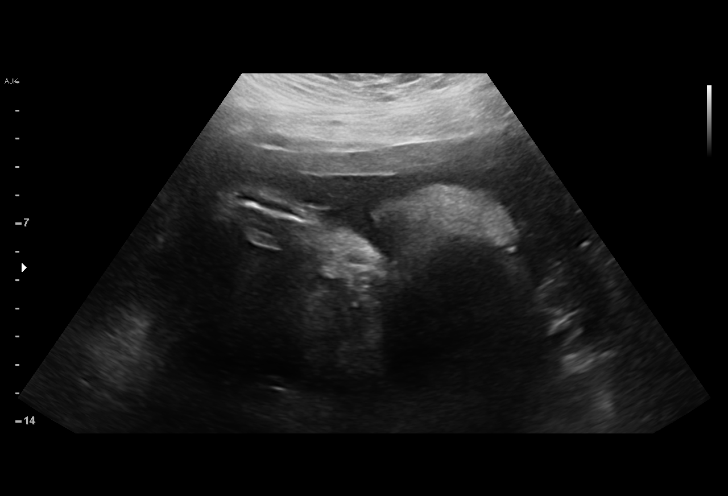

[14 of 28 positions shown; findings below may reference images not displayed]

Road [HOSPITAL]

Indications

30 weeks gestation of pregnancy
Maternal morbid obesity
History of cesarean delivery, currently
pregnant
Encounter for other antenatal screening
follow-up
Gestational diabetes in pregnancy, diet
controlled
Asthma                                         199.B9 [id number]
OB History

Blood Type:            Height:  5'6"   Weight (lb):  370      BMI:
Gravidity:    3         Term:   2        Prem:   0        SAB:   0
TOP:          0       Ectopic:  0        Living: 2
Fetal Evaluation

Num Of Fetuses:     1
Fetal Heart         151
Rate(bpm):
Cardiac Activity:   Observed
Presentation:       Cephalic
Placenta:           Posterior, above cervical os
P. Cord Insertion:  Previously Visualized

Amniotic Fluid
AFI FV:      Subjectively within normal limits

AFI Sum(cm)     %Tile       Largest Pocket(cm)
15.83           57
RUQ(cm)       RLQ(cm)       LUQ(cm)        LLQ(cm)
6.36
Biometry

BPD:      79.1  mm     G. Age:  31w 5d         78  %    CI:        72.92   %   70 - 86
FL/HC:      20.7   %   19.2 -
HC:      294.5  mm     G. Age:  32w 4d         74  %    HC/AC:      1.02       0.99 -
AC:      287.7  mm     G. Age:  32w 5d         95  %    FL/BPD:     77.0   %   71 - 87
FL:       60.9  mm     G. Age:  31w 5d         69  %    FL/AC:      21.2   %   20 - 24
Est. FW:    4592  gm      4 lb 5 oz     81  %
Gestational Age

LMP:           30w 3d       Date:   05/16/16                 EDD:   02/20/17
U/S Today:     32w 1d                                        EDD:   02/08/17
Best:          30w 3d    Det. By:   LMP  (05/16/16)          EDD:   02/20/17
Anatomy

Cranium:               Appears normal         Aortic Arch:            Previously seen
Cavum:                 Appears normal         Ductal Arch:            Not well visualized
Ventricles:            Appears normal         Diaphragm:              Appears normal
Choroid Plexus:        Previously seen        Stomach:                Appears normal, left
sided
Cerebellum:            Previously seen        Abdomen:                Appears normal
Posterior Fossa:       Previously seen        Abdominal Wall:         Previously seen
Nuchal Fold:           Not applicable (>20    Cord Vessels:           Previously seen
wks GA)
Face:                  Orbits and profile     Kidneys:                Appear normal
previously seen
Lips:                  Previously seen        Bladder:                Appears normal
Thoracic:              Appears normal         Spine:                  Ltd views no
intracranial signs of
NT
Heart:                 Appears normal         Upper Extremities:      Previously seen
(4CH, axis, and situs
RVOT:                  Previously seen        Lower Extremities:      Previously seen
LVOT:                  Previously seen

Other:  Fetus appears to be a female. Open hands visualized. Technically
difficult due to maternal habitus and fetal position.
Cervix Uterus Adnexa

Cervix
Not visualized (advanced GA >19wks)

Uterus
No abnormality visualized.

Left Ovary
Within normal limits.

Right Ovary
Within normal limits.

Adnexa:       No abnormality visualized.
Impression

Singleton intrauterine pregnancy at 30+3 weeks with GDM,
here for growth evaluation
Interval review of the anatomy shows no sonographic
markers for aneuploidy or structural anomalies
Amniotic fluid volume is normal
Estimated fetal weight is 1950g which is growth in the 81st
percentile; the AC is in the 95th percentile
Recommendations

This patient is at risk for fetal macrosomia due to her GDM,
based on our findings today. suggest repeat growth
evaluation in 4 weeks

## 2018-07-19 ENCOUNTER — Encounter (HOSPITAL_COMMUNITY): Payer: Self-pay | Admitting: Emergency Medicine

## 2018-07-19 ENCOUNTER — Emergency Department (HOSPITAL_COMMUNITY): Payer: Medicaid Other

## 2018-07-19 ENCOUNTER — Emergency Department (HOSPITAL_COMMUNITY)
Admission: EM | Admit: 2018-07-19 | Discharge: 2018-07-19 | Disposition: A | Discharge status: Home / Self Care | Payer: Medicaid Other | Attending: Emergency Medicine | Admitting: Emergency Medicine

## 2018-07-19 DIAGNOSIS — Z87891 Personal history of nicotine dependence: Secondary | ICD-10-CM | POA: Diagnosis not present

## 2018-07-19 DIAGNOSIS — R509 Fever, unspecified: Secondary | ICD-10-CM | POA: Diagnosis present

## 2018-07-19 DIAGNOSIS — R6889 Other general symptoms and signs: Secondary | ICD-10-CM

## 2018-07-19 DIAGNOSIS — J45909 Unspecified asthma, uncomplicated: Secondary | ICD-10-CM | POA: Diagnosis not present

## 2018-07-19 DIAGNOSIS — J111 Influenza due to unidentified influenza virus with other respiratory manifestations: Secondary | ICD-10-CM | POA: Insufficient documentation

## 2018-07-19 DIAGNOSIS — Z79899 Other long term (current) drug therapy: Secondary | ICD-10-CM | POA: Insufficient documentation

## 2018-07-19 MED ORDER — OSELTAMIVIR PHOSPHATE 75 MG PO CAPS: 75.0000 mg | ORAL_CAPSULE | Freq: Two times a day (BID) | ORAL | 0 refills | Status: DC

## 2018-07-19 MED ORDER — ACETAMINOPHEN 325 MG PO TABS
650.0000 mg | ORAL_TABLET | Freq: Once | ORAL | Status: AC
  Administered 2018-07-19: 650 mg via ORAL
  Filled 2018-07-19: qty 2

## 2018-07-19 NOTE — Discharge Instructions (Addendum)
Please read attached information. If you experience any new or worsening signs or symptoms please return to the emergency room for evaluation. Please follow-up with your primary care provider or specialist as discussed. Please use medication prescribed only as directed and discontinue taking if you have any concerning signs or symptoms.   °

## 2018-07-19 NOTE — ED Provider Notes (Signed)
[facility] Provider Note   CSN: [id number] Arrival date & time: 07/19/18  0831     History   Chief Complaint Chief Complaint  Patient presents with  . Influenza    HPI Tina Grant is a 28 y.o. female.  HPI   28 year old female presents today with complaints of fever.  Patient notes that approximately 4 days ago she developed headache, this was followed by body aches, cough, and fatigue.  She notes symptoms have continued persist, she notes checking her temperature this morning with a temperature of 100.3.  She notes a history of borderline diabetes.  She did not receive a flu vaccine this year.  She has a 30-year-old at home.  She reports she occasionally smokes.  No neck stiffness or neurological deficits.  Past Medical History:  Diagnosis Date  . Anemia   . Asthma   . Complication of anesthesia   . Gestational diabetes    diet controlled  . Infection    uti  . MRSA infection     Patient Active Problem List   Diagnosis Date Noted  . Nexplanon insertion 03/21/2017  . S/P cesarean section 02/13/2017  . Gestational diabetes mellitus (GDM) in third trimester 12/02/2016  . Maternal iron deficiency anemia affecting pregnancy, antepartum, third trimester 12/02/2016  . History of asthma 12/01/2016  . GBS bacteriuria 08/05/2016  . Supervision of high risk pregnancy, antepartum 07/27/2016  . Morbid obesity (HCC) 07/27/2016  . H/O: C-section 07/27/2016    Past Surgical History:  Procedure Laterality Date  . CESAREAN SECTION    . CESAREAN SECTION N/A 02/11/2014  . CESAREAN SECTION N/A 02/13/2017   Procedure: REPEAT CESAREAN SECTION;  Surgeon: Lazaro ArmsEure, Luther H, MD;  [facility];  Service: Obstetrics;  Laterality: N/A;     OB History    Gravida  3   Para  3   Term  1   Preterm      AB      Living  3     SAB      TAB      Ectopic      Multiple  0   Live Births  3        Obstetric Comments  NRFHR  for 1st C-section 2nd C-section for repeat.         Home Medications    Prior to Admission medications   Medication Sig Start Date End Date Taking? Authorizing Provider  acetaminophen (TYLENOL) 500 MG tablet Take 1,000 mg by mouth every 6 (six) hours as needed for mild pain or headache.    [provider]  albuterol (PROVENTIL) (2.5 MG/3ML) 0.083% nebulizer solution Take 2.5 mg by nebulization every 4 (four) hours as needed for wheezing or shortness of breath.  10/16/16   [provider]  diphenhydrAMINE (BENADRYL) 25 mg capsule Take 25 mg by mouth at bedtime as needed for sleep.    [provider]  Etonogestrel (NEXPLANON [place]) Inject into the skin.    [provider]  ibuprofen (ADVIL,MOTRIN) 600 MG tablet Take 1 tablet (600 mg total) by mouth every 6 (six) hours. Patient not taking: Reported on 02/18/2017 02/14/17   Allie Bossierove, Myra C, MD  meclizine (ANTIVERT) 25 MG tablet Take 1 tablet (25 mg total) by mouth 2 (two) times daily as needed for dizziness. 11/17/17   Lennette BihariButler, John Michael, MD  oseltamivir (TAMIFLU) 75 MG capsule Take 1 capsule (75 mg total) by mouth every 12 (twelve) hours. 07/19/18  Merleen Picazo, Tinnie Gens, PA-C  oxyCODONE-acetaminophen (PERCOCET/ROXICET) 5-325 MG tablet Take 1-2 tablets by mouth every 6 (six) hours as needed. Patient not taking: Reported on 02/18/2017 02/15/17   Constant, Peggy, MD  Prenatal-DSS-FeCb-FeGl-FA (CITRANATAL BLOOM) 90-1 MG TABS Take 1 tablet by mouth daily. Patient not taking: Reported on 02/18/2017 12/02/16   Roe Coombs, CNM  PROAIR HFA 108 [phone] Base) MCG/ACT inhaler Inhale 2 puffs into the lungs every 4 (four) hours as needed for wheezing or shortness of breath. 01/12/17   Adam Phenix, MD  ranitidine (ZANTAC) 150 MG tablet Take 150 mg by mouth daily as needed for heartburn.     [provider]    Family History Family History  Problem Relation Age of Onset  . HIV Mother   . Cancer Maternal Grandmother         breast  . Heart disease Maternal Grandfather     Social History Social History   Tobacco Use  . Smoking status: Former Smoker    Types: Cigarettes  . Smokeless tobacco: Never Used  . Tobacco comment: prior to first preg  Substance Use Topics  . Alcohol use: No  . Drug use: No     Allergies   Shellfish allergy and Iodine   Review of Systems Review of Systems  All other systems reviewed and are negative.    Physical Exam Updated Vital Signs BP 118/85   Pulse (!) 104   Temp 99.2 F (37.3 C) (Oral)   Resp 15   SpO2 99%   Physical Exam Vitals signs and nursing note reviewed.  Constitutional:      Appearance: She is well-developed.  HENT:     Head: Normocephalic and atraumatic.     Comments: Oropharynx clear no erythema exudate or edema Eyes:     General: No scleral icterus.       Right eye: No discharge.        Left eye: No discharge.     Conjunctiva/sclera: Conjunctivae normal.     Pupils: Pupils are equal, round, and reactive to light.  Neck:     Musculoskeletal: Normal range of motion and neck supple.     Vascular: No JVD.     Trachea: No tracheal deviation.     Comments: Full active range of motion Pulmonary:     Effort: Pulmonary effort is normal. No respiratory distress.     Breath sounds: Normal breath sounds. No stridor. No wheezing, rhonchi or rales.  Neurological:     Mental Status: She is alert and oriented to person, place, and time.     Coordination: Coordination normal.  Psychiatric:        Behavior: Behavior normal.        Thought Content: Thought content normal.        Judgment: Judgment normal.      ED Treatments / Results  Labs (all labs ordered are listed, but only abnormal results are displayed) Labs Reviewed - No data to display  EKG None  Radiology Dg Chest 2 View  Result Date: 07/19/2018 CLINICAL DATA:  Headache, body aches, chills, dry cough. EXAM: CHEST - 2 VIEW COMPARISON:  None. FINDINGS: Trachea is midline. Heart  size normal. Lungs are clear. No pleural fluid. IMPRESSION: Negative. Electronically Signed   By: Leanna Battles M.D.   On: 07/19/2018 09:24    Procedures Procedures (including critical care time)  Medications Ordered in ED Medications  acetaminophen (TYLENOL) tablet 650 mg (650 mg Oral Given 07/19/18 0907)  Initial Impression / Assessment and Plan / ED Course  I have reviewed the triage vital signs and the nursing notes.  Pertinent labs & imaging results that were available during my care of the patient were reviewed by me and considered in my medical decision making (see chart for details).     28 year old female presents today with flulike symptoms.  Patient has clear lung sounds and negative chest x-ray.  She does have comorbidities including borderline diabetes, morbid obesity.  Patient will be discharged home with Tamiflu, she understands risks and benefits.  Strict return precautions given.  She verbalized understanding and agreement to today's plan.  Final Clinical Impressions(s) / ED Diagnoses   Final diagnoses:  Flu-like symptoms    ED Discharge Orders         Ordered    oseltamivir (TAMIFLU) 75 MG capsule  Every 12 hours     07/19/18 0959           Eyvonne MechanicHedges, Cayenne Breault, PA-C 07/19/18 1000    Jacalyn LefevreHaviland, Julie, MD 07/19/18 1236

## 2018-07-19 NOTE — ED Triage Notes (Signed)
Pt reports headache that began Sunday, cough, body aches and sore throat began monday. Reports fever of 100.3 this am.

## 2018-07-26 ENCOUNTER — Encounter (HOSPITAL_COMMUNITY): Payer: Self-pay | Admitting: Emergency Medicine

## 2018-07-26 ENCOUNTER — Emergency Department (HOSPITAL_COMMUNITY)
Admission: EM | Admit: 2018-07-26 | Discharge: 2018-07-26 | Disposition: A | Discharge status: Home / Self Care | Payer: Medicaid Other | Attending: Emergency Medicine | Admitting: Emergency Medicine

## 2018-07-26 DIAGNOSIS — H00011 Hordeolum externum right upper eyelid: Secondary | ICD-10-CM | POA: Diagnosis not present

## 2018-07-26 DIAGNOSIS — Z87891 Personal history of nicotine dependence: Secondary | ICD-10-CM | POA: Insufficient documentation

## 2018-07-26 DIAGNOSIS — J45909 Unspecified asthma, uncomplicated: Secondary | ICD-10-CM | POA: Insufficient documentation

## 2018-07-26 DIAGNOSIS — H5789 Other specified disorders of eye and adnexa: Secondary | ICD-10-CM | POA: Diagnosis present

## 2018-07-26 DIAGNOSIS — Z79899 Other long term (current) drug therapy: Secondary | ICD-10-CM | POA: Insufficient documentation

## 2018-07-26 NOTE — ED Provider Notes (Signed)
[facility] Provider Note   CSN: [id number] Arrival date & time: 07/26/18  1024     History   Chief Complaint Chief Complaint  Patient presents with  . Eye Pain    HPI Tina Grant is a 28 y.o. female.  HPI   28 year old female presents today with complaints of right eyelid swelling.  Patient notes symptoms started approximately 2 days ago with swelling along the medial upper eyelid.  She notes some edema to the remainder of the eyelid.  She notes no surrounding redness, fever, painful ocular movements.  No discharge or vision changes.  No history of the same.  Past Medical History:  Diagnosis Date  . Anemia   . Asthma   . Complication of anesthesia   . Gestational diabetes    diet controlled  . Infection    uti  . MRSA infection     Patient Active Problem List   Diagnosis Date Noted  . Nexplanon insertion 03/21/2017  . S/P cesarean section 02/13/2017  . Gestational diabetes mellitus (GDM) in third trimester 12/02/2016  . Maternal iron deficiency anemia affecting pregnancy, antepartum, third trimester 12/02/2016  . History of asthma 12/01/2016  . GBS bacteriuria 08/05/2016  . Supervision of high risk pregnancy, antepartum 07/27/2016  . Morbid obesity (HCC) 07/27/2016  . H/O: C-section 07/27/2016    Past Surgical History:  Procedure Laterality Date  . CESAREAN SECTION    . CESAREAN SECTION N/A 02/11/2014  . CESAREAN SECTION N/A 02/13/2017   Procedure: REPEAT CESAREAN SECTION;  Surgeon: Lazaro ArmsEure, Luther H, MD;  [facility];  Service: Obstetrics;  Laterality: N/A;     OB History    Gravida  3   Para  3   Term  1   Preterm      AB      Living  3     SAB      TAB      Ectopic      Multiple  0   Live Births  3        Obstetric Comments  NRFHR for 1st C-section 2nd C-section for repeat.         Home Medications    Prior to Admission medications   Medication Sig Start Date End Date  Taking? Authorizing Provider  acetaminophen (TYLENOL) 500 MG tablet Take 1,000 mg by mouth every 6 (six) hours as needed for mild pain or headache.    [provider]  albuterol (PROVENTIL) (2.5 MG/3ML) 0.083% nebulizer solution Take 2.5 mg by nebulization every 4 (four) hours as needed for wheezing or shortness of breath.  10/16/16   [provider]  diphenhydrAMINE (BENADRYL) 25 mg capsule Take 25 mg by mouth at bedtime as needed for sleep.    [provider]  Etonogestrel (NEXPLANON Nimrod) Inject into the skin.    [provider]  ibuprofen (ADVIL,MOTRIN) 600 MG tablet Take 1 tablet (600 mg total) by mouth every 6 (six) hours. Patient not taking: Reported on 02/18/2017 02/14/17   Allie Bossierove, Myra C, MD  meclizine (ANTIVERT) 25 MG tablet Take 1 tablet (25 mg total) by mouth 2 (two) times daily as needed for dizziness. 11/17/17   Lennette BihariButler, John Michael, MD  oseltamivir (TAMIFLU) 75 MG capsule Take 1 capsule (75 mg total) by mouth every 12 (twelve) hours. 07/19/18   Javonne Louissaint, Tinnie GensJeffrey, PA-C  oxyCODONE-acetaminophen (PERCOCET/ROXICET) 5-325 MG tablet Take 1-2 tablets by mouth every 6 (six) hours as needed. Patient not taking:  Reported on 02/18/2017 02/15/17   Constant, Peggy, MD  Prenatal-DSS-FeCb-FeGl-FA (CITRANATAL BLOOM) 90-1 MG TABS Take 1 tablet by mouth daily. Patient not taking: Reported on 02/18/2017 12/02/16   Roe Coombs, CNM  PROAIR HFA 108 [phone] Base) MCG/ACT inhaler Inhale 2 puffs into the lungs every 4 (four) hours as needed for wheezing or shortness of breath. 01/12/17   Adam Phenix, MD  ranitidine (ZANTAC) 150 MG tablet Take 150 mg by mouth daily as needed for heartburn.     [provider]    Family History Family History  Problem Relation Age of Onset  . HIV Mother   . Cancer Maternal Grandmother        breast  . Heart disease Maternal Grandfather     Social History Social History   Tobacco Use  . Smoking status: Former Smoker    Types:  Cigarettes  . Smokeless tobacco: Never Used  . Tobacco comment: prior to first preg  Substance Use Topics  . Alcohol use: No  . Drug use: No     Allergies   Shellfish allergy and Iodine   Review of Systems Review of Systems  All other systems reviewed and are negative.    Physical Exam Updated Vital Signs BP 127/88 (BP Location: Right Arm)   Pulse 98   Temp 98.3 F (36.8 C) (Oral)   Resp 15   SpO2 98%   Physical Exam Vitals signs and nursing note reviewed.  Constitutional:      Appearance: She is well-developed.  HENT:     Head: Normocephalic and atraumatic.  Eyes:     General: No scleral icterus.       Right eye: No discharge.        Left eye: No discharge.     Conjunctiva/sclera: Conjunctivae normal.     Pupils: Pupils are equal, round, and reactive to light.     Comments: Nodule noted to the upper medial eyelid with edema to the remainder of the lid, no erythema, no lower lid involvement, no conjunctival injection, discharge, vision intact  Neck:     Musculoskeletal: Normal range of motion.     Vascular: No JVD.     Trachea: No tracheal deviation.  Pulmonary:     Effort: Pulmonary effort is normal.     Breath sounds: No stridor.  Neurological:     Mental Status: She is alert and oriented to person, place, and time.     Coordination: Coordination normal.  Psychiatric:        Behavior: Behavior normal.        Thought Content: Thought content normal.        Judgment: Judgment normal.      ED Treatments / Results  Labs (all labs ordered are listed, but only abnormal results are displayed) Labs Reviewed - No data to display  EKG None  Radiology No results found.  Procedures Procedures (including critical care time)  Medications Ordered in ED Medications - No data to display   Initial Impression / Assessment and Plan / ED Course  I have reviewed the triage vital signs and the nursing notes.  Pertinent labs & imaging results that were  available during my care of the patient were reviewed by me and considered in my medical decision making (see chart for details).     28 year old female presents today with likely stye.  She has no complicating features here.  She will be instructed to use warm compress.  If her symptoms do  not improve in 48 hours she will follow-up with ophthalmology for repeat evaluation.  If she develops any new or worsening signs or symptoms she will return immediately to the emergency room.  She has no signs of preseptal or orbital cellulitis.  Patient verbalized understanding and agreement to today's plan had no further questions or concerns at time of discharge.  Final Clinical Impressions(s) / ED Diagnoses   Final diagnoses:  Hordeolum externum of right upper eyelid    ED Discharge Orders    None       Rosalio Loud 07/26/18 1048    Melene Plan, DO 07/26/18 1559

## 2018-07-26 NOTE — Discharge Instructions (Addendum)
Please read attached information. If you experience any new or worsening signs or symptoms please return to the emergency room for evaluation. Please follow-up with your primary care provider or specialist as discussed.  °

## 2018-07-26 NOTE — ED Triage Notes (Signed)
Pt states she noticed a "bump" on her left eye on Monday. Swelling has progressed, and this morning left upper eyelid very swollen. Denies blurry vision.

## 2018-12-02 ENCOUNTER — Emergency Department (HOSPITAL_COMMUNITY): Payer: Medicaid Other

## 2018-12-02 ENCOUNTER — Encounter (HOSPITAL_COMMUNITY): Payer: Self-pay | Admitting: Emergency Medicine

## 2018-12-02 ENCOUNTER — Other Ambulatory Visit: Payer: Self-pay

## 2018-12-02 ENCOUNTER — Emergency Department (HOSPITAL_COMMUNITY)
Admission: EM | Admit: 2018-12-02 | Discharge: 2018-12-02 | Disposition: A | Discharge status: Home / Self Care | Payer: Medicaid Other | Attending: Emergency Medicine | Admitting: Emergency Medicine

## 2018-12-02 DIAGNOSIS — R1010 Upper abdominal pain, unspecified: Secondary | ICD-10-CM | POA: Diagnosis present

## 2018-12-02 DIAGNOSIS — K8020 Calculus of gallbladder without cholecystitis without obstruction: Secondary | ICD-10-CM | POA: Insufficient documentation

## 2018-12-02 DIAGNOSIS — J45909 Unspecified asthma, uncomplicated: Secondary | ICD-10-CM | POA: Insufficient documentation

## 2018-12-02 DIAGNOSIS — Z87891 Personal history of nicotine dependence: Secondary | ICD-10-CM | POA: Insufficient documentation

## 2018-12-02 DIAGNOSIS — Z8632 Personal history of gestational diabetes: Secondary | ICD-10-CM | POA: Diagnosis not present

## 2018-12-02 LAB — COMPREHENSIVE METABOLIC PANEL
ALT: 13 U/L (ref 0–44)
AST: 11 U/L — ABNORMAL LOW (ref 15–41)
Albumin: 3.3 g/dL — ABNORMAL LOW (ref 3.5–5.0)
Alkaline Phosphatase: 59 U/L (ref 38–126)
Anion gap: 13 (ref 5–15)
BUN: 12 mg/dL (ref 6–20)
CO2: 21 mmol/L — ABNORMAL LOW (ref 22–32)
Calcium: 9.2 mg/dL (ref 8.9–10.3)
Chloride: 103 mmol/L (ref 98–111)
Creatinine, Ser: 0.81 mg/dL (ref 0.44–1.00)
GFR calc Af Amer: >60 mL/min (ref >60)
GFR calc non Af Amer: >60 mL/min (ref >60)
Glucose, Bld: 101 mg/dL — ABNORMAL HIGH (ref 70–99)
Potassium: 4.1 mmol/L (ref 3.5–5.1)
Sodium: 137 mmol/L (ref 135–145)
Total Bilirubin: 0.6 mg/dL (ref 0.3–1.2)
Total Protein: 6.6 g/dL (ref 6.5–8.1)

## 2018-12-02 LAB — URINALYSIS, ROUTINE W REFLEX MICROSCOPIC
Bilirubin Urine: NEGATIVE
Glucose, UA: NEGATIVE mg/dL
Hgb urine dipstick: NEGATIVE
Ketones, ur: NEGATIVE mg/dL
Leukocytes,Ua: NEGATIVE
Nitrite: NEGATIVE
Protein, ur: NEGATIVE mg/dL
Specific Gravity, Urine: 1.015 (ref 1.005–1.030)
pH: 5 (ref 5.0–8.0)

## 2018-12-02 LAB — CBC WITH DIFFERENTIAL/PLATELET
Abs Immature Granulocytes: 0.02 10*3/uL (ref 0.00–0.07)
Basophils Absolute: 0 10*3/uL (ref 0.0–0.1)
Basophils Relative: 0 %
Eosinophils Absolute: 0.2 10*3/uL (ref 0.0–0.5)
Eosinophils Relative: 2 %
HCT: 34 % — ABNORMAL LOW (ref 36.0–46.0)
Hemoglobin: 10.9 g/dL — ABNORMAL LOW (ref 12.0–15.0)
Immature Granulocytes: 0 %
Lymphocytes Relative: 41 %
Lymphs Abs: 3.6 10*3/uL (ref 0.7–4.0)
MCH: 23 pg — ABNORMAL LOW (ref 26.0–34.0)
MCHC: 32.1 g/dL (ref 30.0–36.0)
MCV: 71.9 fL — ABNORMAL LOW (ref 80.0–100.0)
Monocytes Absolute: 0.6 10*3/uL (ref 0.1–1.0)
Monocytes Relative: 6 %
Neutro Abs: 4.4 10*3/uL (ref 1.7–7.7)
Neutrophils Relative %: 51 %
Platelets: 290 10*3/uL (ref 150–400)
RBC: 4.73 MIL/uL (ref 3.87–5.11)
RDW: 17.9 % — ABNORMAL HIGH (ref 11.5–15.5)
WBC: 8.8 10*3/uL (ref 4.0–10.5)
nRBC: 0 % (ref 0.0–0.2)

## 2018-12-02 LAB — TROPONIN I: Troponin I: <0.03 ng/mL (ref <0.03)

## 2018-12-02 LAB — I-STAT BETA HCG BLOOD, ED (MC, WL, AP ONLY): I-stat hCG, quantitative: <5 m[IU]/mL (ref <5)

## 2018-12-02 LAB — LIPASE, BLOOD: Lipase: 21 U/L (ref 11–51)

## 2018-12-02 MED ORDER — HYDROMORPHONE HCL 1 MG/ML IJ SOLN: 1.0000 mg | Freq: Once | INTRAMUSCULAR | Status: DC

## 2018-12-02 MED ORDER — KETOROLAC TROMETHAMINE 30 MG/ML IJ SOLN: 30.0000 mg | Freq: Once | INTRAMUSCULAR | Status: DC

## 2018-12-02 MED ORDER — HYDROCODONE-ACETAMINOPHEN 5-325 MG PO TABS: 1.0000 | ORAL_TABLET | ORAL | 0 refills | Status: DC | PRN

## 2018-12-02 MED ORDER — OXYCODONE-ACETAMINOPHEN 5-325 MG PO TABS: 1.0000 | ORAL_TABLET | Freq: Two times a day (BID) | ORAL | 0 refills | Status: DC | PRN

## 2018-12-02 MED ORDER — ONDANSETRON HCL 4 MG PO TABS: 4.0000 mg | ORAL_TABLET | Freq: Four times a day (QID) | ORAL | 0 refills | Status: DC | PRN

## 2018-12-02 MED ORDER — ONDANSETRON HCL 4 MG/2ML IJ SOLN: 4.0000 mg | Freq: Once | INTRAMUSCULAR | Status: DC

## 2018-12-02 NOTE — ED Triage Notes (Addendum)
Pt presents to ED c/o mid CP fro 2 days. Pt says today it began shooting to arm and back. Worsened by lying. Pt took motrin w/ mild relief.

## 2018-12-02 NOTE — ED Notes (Signed)
Patient verbalizes understanding of discharge instructions. Opportunity for questioning and answers were provided. Pt discharged from ED. 

## 2018-12-02 NOTE — ED Provider Notes (Signed)
[facility] Provider Note   CSN: [id number] Arrival date & time: 12/02/18  0425    History   Chief Complaint Chief Complaint  Patient presents with  . Abdominal Pain    HPI Tina Grant is a 28 y.o. female.     Patient presents to the emergency department for evaluation of upper abdominal and chest pain.  Symptoms began 2 days ago.  Initially it was only slight discomfort and she thought it was gas.  Today, however, the pain worsened.  Tonight she has been experiencing pain in the center of her upper abdomen that radiates straight through into her back.  She was unable to find a comfortable position to lie down and tonight, certain positions made the pain worse.  She had some pain radiating into the left arm earlier tonight, however that resolved after taking Motrin.     Past Medical History:  Diagnosis Date  . Anemia   . Asthma   . Complication of anesthesia   . Gestational diabetes    diet controlled  . Infection    uti  . MRSA infection     Patient Active Problem List   Diagnosis Date Noted  . Nexplanon insertion 03/21/2017  . S/P cesarean section 02/13/2017  . Gestational diabetes mellitus (GDM) in third trimester 12/02/2016  . Maternal iron deficiency anemia affecting pregnancy, antepartum, third trimester 12/02/2016  . History of asthma 12/01/2016  . GBS bacteriuria 08/05/2016  . Supervision of high risk pregnancy, antepartum 07/27/2016  . Morbid obesity (HCC) 07/27/2016  . H/O: C-section 07/27/2016    Past Surgical History:  Procedure Laterality Date  . CESAREAN SECTION    . CESAREAN SECTION N/A 02/11/2014  . CESAREAN SECTION N/A 02/13/2017   Procedure: REPEAT CESAREAN SECTION;  Surgeon: Lazaro ArmsEure, Luther H, MD;  [facility];  Service: Obstetrics;  Laterality: N/A;     OB History    Gravida  3   Para  3   Term  1   Preterm      AB      Living  3     SAB      TAB      Ectopic      Multiple  0   Live Births  3        Obstetric Comments  NRFHR for 1st C-section 2nd C-section for repeat.         Home Medications    Prior to Admission medications   Medication Sig Start Date End Date Taking? Authorizing Provider  etonogestrel (NEXPLANON) 68 MG IMPL implant 68 mg by Subdermal route once.   Yes [provider]  meclizine (ANTIVERT) 25 MG tablet Take 1 tablet (25 mg total) by mouth 2 (two) times daily as needed for dizziness. 11/17/17  Yes Lennette BihariButler, John Michael, MD  PROAIR HFA 108 [phone](90 Base) MCG/ACT inhaler Inhale 2 puffs into the lungs every 4 (four) hours as needed for wheezing or shortness of breath. 01/12/17  Yes Adam PhenixArnold, James G, MD  HYDROcodone-acetaminophen (NORCO/VICODIN) 5-325 MG tablet Take 1 tablet by mouth every 4 (four) hours as needed for moderate pain. 12/02/18   Gilda Crease,  J, MD  ondansetron (ZOFRAN) 4 MG tablet Take 1 tablet (4 mg total) by mouth every 6 (six) hours as needed for nausea or vomiting. 12/02/18   , Canary Brimhristopher J, MD    Family History Family History  Problem Relation Age of Onset  . HIV Mother   . Cancer Maternal  Grandmother        breast  . Heart disease Maternal Grandfather     Social History Social History   Tobacco Use  . Smoking status: Former Smoker    Types: Cigarettes  . Smokeless tobacco: Never Used  . Tobacco comment: prior to first preg  Substance Use Topics  . Alcohol use: Yes    Comment: occasional  . Drug use: No     Allergies   Shellfish allergy and Iodine   Review of Systems Review of Systems   Physical Exam Updated Vital Signs BP 107/76 (BP Location: Left Arm)   Pulse (!) 103   Temp 98.5 F (36.9 C) (Oral)   Resp 18   Ht 5\' 6"  (1.676 m)   Wt (!) 159.7 kg   SpO2 100%   BMI 56.81 kg/m   Physical Exam   ED Treatments / Results  Labs (all labs ordered are listed, but only abnormal results are displayed) Labs Reviewed  CBC WITH DIFFERENTIAL/PLATELET - Abnormal;  Notable for the following components:      Result Value   Hemoglobin 10.9 (*)    HCT 34.0 (*)    MCV 71.9 (*)    MCH 23.0 (*)    RDW 17.9 (*)    All other components within normal limits  COMPREHENSIVE METABOLIC PANEL - Abnormal; Notable for the following components:   CO2 21 (*)    Glucose, Bld 101 (*)    Albumin 3.3 (*)    AST 11 (*)    All other components within normal limits  LIPASE, BLOOD  TROPONIN I  URINALYSIS, ROUTINE W REFLEX MICROSCOPIC  I-STAT BETA HCG BLOOD, ED ([facility], [facility], [facility] ONLY)    EKG None  Radiology Dg Chest 2 View  Result Date: 12/02/2018 CLINICAL DATA:  Mid chest pain EXAM: CHEST - 2 VIEW COMPARISON:  07/19/2018 FINDINGS: Lungs are clear.  No pleural effusion or pneumothorax. The heart is normal in size. Visualized osseous structures are within normal limits. IMPRESSION: Normal chest radiographs. Electronically Signed   By: Charline Bills M.D.   On: 12/02/2018 06:10   US Abdomen Limited Ruq  Result Date: 12/02/2018 CLINICAL DATA:  Epigastric pain EXAM: ULTRASOUND ABDOMEN LIMITED RIGHT UPPER QUADRANT COMPARISON:  None. FINDINGS: Gallbladder: Numerous gallstones (wall echo shadow sign). No gallbladder wall thickening or pericholecystic fluid. Common bile duct: Diameter: 4 mm Liver: At the upper limits of normal for parenchymal echogenicity. No focal hepatic lesion is seen. Portal vein is patent on color Doppler imaging with normal direction of blood flow towards the liver. IMPRESSION: Cholelithiasis, without associated findings to suggest acute cholecystitis. Electronically Signed   By: Charline Bills M.D.   On: 12/02/2018 06:11    Procedures Procedures (including critical care time)  Medications Ordered in ED Medications  ketorolac (TORADOL) 30 MG/ML injection 30 mg (has no administration in time range)  HYDROmorphone (DILAUDID) injection 1 mg (has no administration in time range)  ondansetron (ZOFRAN) injection 4 mg (has no administration in time range)      Initial Impression / Assessment and Plan / ED Course  I have reviewed the triage vital signs and the nursing notes.  Pertinent labs & imaging results that were available during my care of the patient were reviewed by me and considered in my medical decision making (see chart for details).        Presents for evaluation of abdominal and chest pain radiating into the back.  Patient's pain appears to be centered around the epigastric  and right upper quadrant.  Examination reveals tenderness in this area.  Pain is been present for 2 days, progressively worsening.  This does not seem cardiac in nature.  EKG normal, troponin negative.  Patient's blood work was also unremarkable.  Patient underwent ultrasound of right upper quadrant to further evaluate for possible gallstone and cholecystitis.  Patient does have cholelithiasis but no evidence of cholecystitis.  She was treated with analgesia here in the ER, will be discharged with as needed analgesia and follow-up with general surgery.  Final Clinical Impressions(s) / ED Diagnoses   Final diagnoses:  Calculus of gallbladder without cholecystitis without obstruction    ED Discharge Orders         Ordered    ondansetron (ZOFRAN) 4 MG tablet  Every 6 hours PRN     12/02/18 0636    HYDROcodone-acetaminophen (NORCO/VICODIN) 5-325 MG tablet  Every 4 hours PRN     12/02/18 0636           Gilda Crease, MD 12/02/18 [phone]

## 2018-12-02 NOTE — ED Notes (Signed)
Patient transported to X-ray 

## 2018-12-13 ENCOUNTER — Ambulatory Visit: Payer: Self-pay | Admitting: General Surgery

## 2018-12-15 ENCOUNTER — Other Ambulatory Visit: Payer: Self-pay | Admitting: Obstetrics & Gynecology

## 2018-12-15 DIAGNOSIS — Z8709 Personal history of other diseases of the respiratory system: Secondary | ICD-10-CM

## 2018-12-18 ENCOUNTER — Other Ambulatory Visit: Payer: Self-pay | Admitting: Obstetrics & Gynecology

## 2018-12-18 DIAGNOSIS — Z8709 Personal history of other diseases of the respiratory system: Secondary | ICD-10-CM

## 2019-01-23 NOTE — Pre-Procedure Instructions (Signed)
Ihor Austinhija N Lariviere  01/23/2019      [facility] - [place], [place] - [address] [place] [id number] Phone: [phone] Fax: [phone]  [facility] [phone]#[id number] [place] - [id number] [facility] [address] [place] [phone] Phone: [phone] Fax: [phone]    Your procedure is scheduled on January 31, 2019.  [facility] at 630 AM.  Call this number if you have problems the morning of surgery:  [phone]   Remember:  Do not eat or drink after midnight.  You may drink clear liquids until 0530 AM.  Clear liquids allowed are:  Water, Juice (non-citric and without pulp), Clear Tea, Black Coffee only and Gatorade    Take these medicines the morning of surgery with A SIP OF WATER  Albuterol nebulizer-if needed Flovent Inhaler Meclizine (antivert)-if needed Pro air inhaler  Bring all of your inhalers with you  7 days prior to surgery STOP taking any Aspirin (unless otherwise instructed by your surgeon), Aleve, Naproxen, Ibuprofen, Motrin, Advil, Goody's, BC's, all herbal medications, fish oil, and all vitamins   WHAT DO I DO ABOUT MY DIABETES MEDICATION?  [name]. Do not take oral diabetes medicines (pills) the morning of surgery -metformin (glucophage).  Reviewed and Endorsed by [facility] Patient Education Committee, August 2015  How to Manage Your Diabetes Before and After Surgery  Why is it important to control my blood sugar before and after surgery? . Improving blood sugar levels before and after surgery helps healing and can limit problems. . A way of improving blood sugar control is eating a healthy diet by: o  Eating less sugar and carbohydrates o  Increasing activity/exercise o  Talking with your doctor about reaching your blood sugar goals . High blood sugars (greater than 180 mg/dL) can raise your risk of infections and slow your  recovery, so you will need to focus on controlling your diabetes during the weeks before surgery. . Make sure that the doctor who takes care of your diabetes knows about your planned surgery including the date and location.  How do I manage my blood sugar before surgery? . Check your blood sugar at least 4 times a day, starting 2 days before surgery, to make sure that the level is not too high or low. o Check your blood sugar the morning of your surgery when you wake up and every 2 hours until you get to the Short Stay unit. . If your blood sugar is less than 70 mg/dL, you will need to treat for low blood sugar: o Do not take insulin. o Treat a low blood sugar (less than 70 mg/dL) with  cup of clear juice (cranberry or apple), 4 glucose tablets, OR glucose gel. Recheck blood sugar in 15 minutes after treatment (to make sure it is greater than 70 mg/dL). If your blood sugar is not greater than 70 mg/dL on recheck, call [phone] o  for further instructions. . Report your blood sugar to the short stay nurse when you get to Short Stay.  . If you are admitted to the hospital after surgery: o Your blood sugar will be checked by the staff and you will probably be given insulin after surgery (instead of oral diabetes medicines) to make sure you have good blood sugar levels. o The goal for blood sugar control after surgery is 80-180 mg/dL.   [place]- Preparing For Surgery  Before  surgery, you can play an important role. Because skin is not sterile, your skin needs to be as free of germs as possible. You can reduce the number of germs on your skin by washing with CHG (chlorahexidine gluconate) Soap before surgery.  CHG is an antiseptic cleaner which kills germs and bonds with the skin to continue killing germs even after washing.    Oral Hygiene is also important to reduce your risk of infection.  Remember - BRUSH YOUR TEETH THE MORNING OF SURGERY WITH YOUR REGULAR TOOTHPASTE  Please do not  use if you have an allergy to CHG or antibacterial soaps. If your skin becomes reddened/irritated stop using the CHG.  Do not shave (including legs and underarms) for at least 48 hours prior to first CHG shower. It is OK to shave your face.  Please follow these instructions carefully.   1. Shower the NIGHT BEFORE SURGERY and the MORNING OF SURGERY with CHG.   2. If you chose to wash your hair, wash your hair first as usual with your normal shampoo.  3. After you shampoo, rinse your hair and body thoroughly to remove the shampoo.  4. Use CHG as you would any other liquid soap. You can apply CHG directly to the skin and wash gently with a scrungie or a clean washcloth.   5. Apply the CHG Soap to your body ONLY FROM THE NECK DOWN.  Do not use on open wounds or open sores. Avoid contact with your eyes, ears, mouth and genitals (private parts). Wash Face and genitals (private parts)  with your normal soap.  6. Wash thoroughly, paying special attention to the area where your surgery will be performed.  7. Thoroughly rinse your body with warm water from the neck down.  8. DO NOT shower/wash with your normal soap after using and rinsing off the CHG Soap.  9. Pat yourself dry with a CLEAN TOWEL.  10. Wear CLEAN PAJAMAS to bed the night before surgery, wear comfortable clothes the morning of surgery  11. Place CLEAN SHEETS on your bed the night of your first shower and DO NOT SLEEP WITH PETS.  Day of Surgery:  Do not apply any deodorants/lotions.  Please wear clean clothes to the hospital/surgery center.   Remember to brush your teeth WITH YOUR REGULAR TOOTHPASTE.    Do not wear jewelry.  Do not wear lotions, powders, or colognes, or deodorant.  Men may shave face and neck.  Do not bring valuables to the hospital.  [facility] is not responsible for any belongings or valuables.  Contacts, dentures or bridgework may not be worn into surgery.  Leave your suitcase in the car.  After surgery  it may be brought to your room.  For patients admitted to the hospital, discharge time will be determined by your treatment team.  Patients discharged the day of surgery will not be allowed to drive home.   Please read over the following fact sheets that you were given.

## 2019-01-24 ENCOUNTER — Other Ambulatory Visit: Payer: Self-pay

## 2019-01-24 ENCOUNTER — Encounter (HOSPITAL_COMMUNITY)
Admission: RE | Admit: 2019-01-24 | Discharge: 2019-01-24 | Disposition: A | Discharge status: Home / Self Care | Payer: Medicaid Other | Source: Ambulatory Visit | Attending: General Surgery | Admitting: General Surgery

## 2019-01-24 ENCOUNTER — Encounter (HOSPITAL_COMMUNITY): Payer: Self-pay

## 2019-01-24 DIAGNOSIS — Z01812 Encounter for preprocedural laboratory examination: Secondary | ICD-10-CM | POA: Insufficient documentation

## 2019-01-24 HISTORY — DX: Dyspnea, unspecified: R06.00

## 2019-01-24 LAB — BASIC METABOLIC PANEL
Anion gap: 10 (ref 5–15)
BUN: 11 mg/dL (ref 6–20)
CO2: 19 mmol/L — ABNORMAL LOW (ref 22–32)
Calcium: 8.8 mg/dL — ABNORMAL LOW (ref 8.9–10.3)
Chloride: 109 mmol/L (ref 98–111)
Creatinine, Ser: 0.6 mg/dL (ref 0.44–1.00)
GFR calc Af Amer: >60 mL/min (ref >60)
GFR calc non Af Amer: >60 mL/min (ref >60)
Glucose, Bld: 100 mg/dL — ABNORMAL HIGH (ref 70–99)
Potassium: 4 mmol/L (ref 3.5–5.1)
Sodium: 138 mmol/L (ref 135–145)

## 2019-01-24 LAB — CBC
HCT: 33 % — ABNORMAL LOW (ref 36.0–46.0)
Hemoglobin: 11.3 g/dL — ABNORMAL LOW (ref 12.0–15.0)
MCH: 25.1 pg — ABNORMAL LOW (ref 26.0–34.0)
MCHC: 34.2 g/dL (ref 30.0–36.0)
MCV: 73.3 fL — ABNORMAL LOW (ref 80.0–100.0)
Platelets: 649 10*3/uL — ABNORMAL HIGH (ref 150–400)
RBC: 4.5 MIL/uL (ref 3.87–5.11)
RDW: 19 % — ABNORMAL HIGH (ref 11.5–15.5)
WBC: 6.3 10*3/uL (ref 4.0–10.5)
nRBC: 0.3 % — ABNORMAL HIGH (ref 0.0–0.2)

## 2019-01-24 LAB — HEMOGLOBIN A1C
Hgb A1c MFr Bld: 6.4 % — ABNORMAL HIGH (ref 4.8–5.6)
Mean Plasma Glucose: 136.98 mg/dL

## 2019-01-24 LAB — GLUCOSE, CAPILLARY
Glucose-Capillary: 101 mg/dL — ABNORMAL HIGH (ref 70–99)
Glucose-Capillary: 102 mg/dL — ABNORMAL HIGH (ref 70–99)

## 2019-01-24 NOTE — Progress Notes (Signed)
PCP -  [facility]  Cardiologist - na  Chest x-ray - 12/02/18 EKG - 6/20 Stress Test - na ECHO - na Cardiac Cath - na  Sleep Study - na CPAP -   Fasting Blood Sugar - 98-101 Checks Blood Sugar _____ times a day  Blood Thinner Instructions:na Aspirin Instructions:  Anesthesia review:   Patient denies shortness of breath, fever, cough and chest pain at PAT appointment   Patient verbalized understanding of instructions that were given to them at the PAT appointment. Patient was also instructed that they will need to review over the PAT instructions again at home before surgery.

## 2019-01-27 ENCOUNTER — Other Ambulatory Visit (HOSPITAL_COMMUNITY)
Admission: RE | Admit: 2019-01-27 | Discharge: 2019-01-27 | Disposition: A | Discharge status: Home / Self Care | Payer: Medicaid Other | Source: Ambulatory Visit | Attending: General Surgery | Admitting: General Surgery

## 2019-01-27 DIAGNOSIS — Z1159 Encounter for screening for other viral diseases: Secondary | ICD-10-CM | POA: Insufficient documentation

## 2019-01-27 LAB — SARS CORONAVIRUS 2 (TAT 6-24 HRS): SARS Coronavirus 2: NEGATIVE

## 2019-01-30 MED ORDER — DEXTROSE 5 % IV SOLN
3.0000 g | INTRAVENOUS | Status: AC
  Administered 2019-01-31: 3 g via INTRAVENOUS
  Filled 2019-01-30: qty 3

## 2019-01-30 NOTE — Anesthesia Preprocedure Evaluation (Addendum)
Anesthesia Evaluation  Patient identified by MRN, date of birth, ID band Patient awake    Reviewed: Allergy & Precautions, NPO status , Patient's Chart, lab work & pertinent test results  History of Anesthesia Complications Negative for: history of anesthetic complications  Airway Mallampati: III  TM Distance: >3 FB Neck ROM: Full    Dental  (+) Teeth Intact   Pulmonary asthma , former smoker,    Pulmonary exam normal        Cardiovascular negative cardio ROS Normal cardiovascular exam     Neuro/Psych negative neurological ROS  negative psych ROS   GI/Hepatic negative GI ROS, Neg liver ROS,   Endo/Other  diabetesMorbid obesity  Renal/GU negative Renal ROS  negative genitourinary   Musculoskeletal negative musculoskeletal ROS (+)   Abdominal (+) + obese,   Peds  Hematology  (+) anemia ,   Anesthesia Other Findings   Reproductive/Obstetrics                            Anesthesia Physical Anesthesia Plan  ASA: III  Anesthesia Plan: General   Post-op Pain Management:    Induction: Intravenous and Rapid sequence  PONV Risk Score and Plan: 3 and Ondansetron, Dexamethasone, Midazolam and Treatment may vary due to age or medical condition  Airway Management Planned: Oral ETT and Video Laryngoscope Planned  Additional Equipment: None  Intra-op Plan:   Post-operative Plan: Extubation in OR  Informed Consent: I have reviewed the patients History and Physical, chart, labs and discussed the procedure including the risks, benefits and alternatives for the proposed anesthesia with the patient or authorized representative who has indicated his/her understanding and acceptance.     Dental advisory given  Plan Discussed with:   Anesthesia Plan Comments:        Anesthesia Quick Evaluation

## 2019-01-31 ENCOUNTER — Ambulatory Visit (HOSPITAL_COMMUNITY): Payer: Medicaid Other | Admitting: Physician Assistant

## 2019-01-31 ENCOUNTER — Other Ambulatory Visit: Payer: Self-pay

## 2019-01-31 ENCOUNTER — Encounter (HOSPITAL_COMMUNITY): Payer: Self-pay

## 2019-01-31 ENCOUNTER — Ambulatory Visit (HOSPITAL_COMMUNITY)
Admission: RE | Admit: 2019-01-31 | Discharge: 2019-02-01 | Disposition: A | Discharge status: Home / Self Care | Payer: Medicaid Other | Source: Ambulatory Visit | Attending: General Surgery | Admitting: General Surgery

## 2019-01-31 ENCOUNTER — Ambulatory Visit (HOSPITAL_COMMUNITY): Payer: Medicaid Other | Admitting: Certified Registered Nurse Anesthetist

## 2019-01-31 ENCOUNTER — Encounter (HOSPITAL_COMMUNITY)
Admission: RE | Disposition: A | Discharge status: Home / Self Care | Payer: Self-pay | Source: Ambulatory Visit | Attending: General Surgery

## 2019-01-31 DIAGNOSIS — Z6841 Body Mass Index (BMI) 40.0 and over, adult: Secondary | ICD-10-CM | POA: Insufficient documentation

## 2019-01-31 DIAGNOSIS — K8020 Calculus of gallbladder without cholecystitis without obstruction: Secondary | ICD-10-CM | POA: Diagnosis present

## 2019-01-31 DIAGNOSIS — Z7951 Long term (current) use of inhaled steroids: Secondary | ICD-10-CM | POA: Diagnosis not present

## 2019-01-31 DIAGNOSIS — K8010 Calculus of gallbladder with chronic cholecystitis without obstruction: Secondary | ICD-10-CM | POA: Diagnosis not present

## 2019-01-31 DIAGNOSIS — Z79899 Other long term (current) drug therapy: Secondary | ICD-10-CM | POA: Diagnosis not present

## 2019-01-31 DIAGNOSIS — J45909 Unspecified asthma, uncomplicated: Secondary | ICD-10-CM | POA: Diagnosis not present

## 2019-01-31 DIAGNOSIS — Z87891 Personal history of nicotine dependence: Secondary | ICD-10-CM | POA: Diagnosis not present

## 2019-01-31 DIAGNOSIS — D649 Anemia, unspecified: Secondary | ICD-10-CM | POA: Diagnosis not present

## 2019-01-31 HISTORY — PX: CHOLECYSTECTOMY: SHX55

## 2019-01-31 LAB — POCT PREGNANCY, URINE: Preg Test, Ur: NEGATIVE

## 2019-01-31 LAB — GLUCOSE, CAPILLARY
Glucose-Capillary: 118 mg/dL — ABNORMAL HIGH (ref 70–99)
Glucose-Capillary: 150 mg/dL — ABNORMAL HIGH (ref 70–99)
Glucose-Capillary: 131 mg/dL — ABNORMAL HIGH (ref 70–99)

## 2019-01-31 SURGERY — LAPAROSCOPIC CHOLECYSTECTOMY
Anesthesia: General | Site: Abdomen

## 2019-01-31 MED ORDER — PROPOFOL 10 MG/ML IV BOLUS
INTRAVENOUS | Status: AC
  Filled 2019-01-31: qty 40

## 2019-01-31 MED ORDER — DEXAMETHASONE SODIUM PHOSPHATE 10 MG/ML IJ SOLN
INTRAMUSCULAR | Status: DC | PRN
  Administered 2019-01-31: 10 mg via INTRAVENOUS

## 2019-01-31 MED ORDER — BUPIVACAINE-EPINEPHRINE 0.25% -1:200000 IJ SOLN
INTRAMUSCULAR | Status: DC | PRN
  Administered 2019-01-31: 23 mL

## 2019-01-31 MED ORDER — KCL IN DEXTROSE-NACL 20-5-0.9 MEQ/L-%-% IV SOLN
INTRAVENOUS | Status: DC
  Administered 2019-01-31: 18:00:00 via INTRAVENOUS
  Filled 2019-01-31 (×2): qty 1000

## 2019-01-31 MED ORDER — OXYCODONE HCL 5 MG PO TABS
ORAL_TABLET | ORAL | Status: AC
  Filled 2019-01-31: qty 1

## 2019-01-31 MED ORDER — PANTOPRAZOLE SODIUM 40 MG IV SOLR
40.0000 mg | Freq: Every day | INTRAVENOUS | Status: DC
  Administered 2019-01-31: 40 mg via INTRAVENOUS
  Filled 2019-01-31: qty 40

## 2019-01-31 MED ORDER — MIDAZOLAM HCL 2 MG/2ML IJ SOLN
INTRAMUSCULAR | Status: DC | PRN
  Administered 2019-01-31: 2 mg via INTRAVENOUS

## 2019-01-31 MED ORDER — ALBUTEROL SULFATE (2.5 MG/3ML) 0.083% IN NEBU
2.5000 mg | INHALATION_SOLUTION | Freq: Four times a day (QID) | RESPIRATORY_TRACT | Status: DC | PRN
  Administered 2019-01-31: 2.5 mg via RESPIRATORY_TRACT

## 2019-01-31 MED ORDER — ONDANSETRON HCL 4 MG/2ML IJ SOLN
INTRAMUSCULAR | Status: DC | PRN
  Administered 2019-01-31: 4 mg via INTRAVENOUS

## 2019-01-31 MED ORDER — ALBUTEROL SULFATE HFA 108 (90 BASE) MCG/ACT IN AERS
INHALATION_SPRAY | RESPIRATORY_TRACT | Status: DC | PRN
  Administered 2019-01-31 (×2): 4 via RESPIRATORY_TRACT

## 2019-01-31 MED ORDER — SODIUM CHLORIDE 0.9 % IR SOLN
Status: DC | PRN
  Administered 2019-01-31: 1000 mL

## 2019-01-31 MED ORDER — FENTANYL CITRATE (PF) 100 MCG/2ML IJ SOLN
INTRAMUSCULAR | Status: AC
  Filled 2019-01-31: qty 2

## 2019-01-31 MED ORDER — FENTANYL CITRATE (PF) 100 MCG/2ML IJ SOLN
INTRAMUSCULAR | Status: DC | PRN
  Administered 2019-01-31 (×2): 100 ug via INTRAVENOUS
  Administered 2019-01-31: 50 ug via INTRAVENOUS

## 2019-01-31 MED ORDER — SUGAMMADEX SODIUM 500 MG/5ML IV SOLN
INTRAVENOUS | Status: AC
  Filled 2019-01-31: qty 5

## 2019-01-31 MED ORDER — MORPHINE SULFATE (PF) 2 MG/ML IV SOLN: 1.0000 mg | INTRAVENOUS | Status: DC | PRN

## 2019-01-31 MED ORDER — ROCURONIUM BROMIDE 10 MG/ML (PF) SYRINGE
PREFILLED_SYRINGE | INTRAVENOUS | Status: DC | PRN
  Administered 2019-01-31: 70 mg via INTRAVENOUS

## 2019-01-31 MED ORDER — MIDAZOLAM HCL 2 MG/2ML IJ SOLN
INTRAMUSCULAR | Status: AC
  Filled 2019-01-31: qty 2

## 2019-01-31 MED ORDER — LIDOCAINE 2% (20 MG/ML) 5 ML SYRINGE
INTRAMUSCULAR | Status: DC | PRN
  Administered 2019-01-31: 60 mg via INTRAVENOUS

## 2019-01-31 MED ORDER — FENTANYL CITRATE (PF) 250 MCG/5ML IJ SOLN
INTRAMUSCULAR | Status: AC
  Filled 2019-01-31: qty 5

## 2019-01-31 MED ORDER — ONDANSETRON 4 MG PO TBDP: 4.0000 mg | ORAL_TABLET | Freq: Four times a day (QID) | ORAL | Status: DC | PRN

## 2019-01-31 MED ORDER — SUCCINYLCHOLINE CHLORIDE 200 MG/10ML IV SOSY
PREFILLED_SYRINGE | INTRAVENOUS | Status: DC | PRN
  Administered 2019-01-31: 160 mg via INTRAVENOUS

## 2019-01-31 MED ORDER — OXYCODONE HCL 5 MG/5ML PO SOLN: 5.0000 mg | Freq: Once | ORAL | Status: AC | PRN

## 2019-01-31 MED ORDER — ACETAMINOPHEN 500 MG PO TABS
1000.0000 mg | ORAL_TABLET | ORAL | Status: AC
  Administered 2019-01-31: 1000 mg via ORAL
  Filled 2019-01-31: qty 2

## 2019-01-31 MED ORDER — SUGAMMADEX SODIUM 200 MG/2ML IV SOLN
INTRAVENOUS | Status: DC | PRN
  Administered 2019-01-31: 400 mg via INTRAVENOUS

## 2019-01-31 MED ORDER — MONTELUKAST SODIUM 10 MG PO TABS
10.0000 mg | ORAL_TABLET | Freq: Every day | ORAL | Status: DC
  Administered 2019-01-31: 10 mg via ORAL
  Filled 2019-01-31: qty 1

## 2019-01-31 MED ORDER — PROPOFOL 10 MG/ML IV BOLUS
INTRAVENOUS | Status: DC | PRN
  Administered 2019-01-31: 200 mg via INTRAVENOUS

## 2019-01-31 MED ORDER — ONDANSETRON HCL 4 MG/2ML IJ SOLN: 4.0000 mg | Freq: Once | INTRAMUSCULAR | Status: DC | PRN

## 2019-01-31 MED ORDER — OXYCODONE HCL 5 MG PO TABS
5.0000 mg | ORAL_TABLET | Freq: Once | ORAL | Status: AC | PRN
  Administered 2019-01-31: 5 mg via ORAL

## 2019-01-31 MED ORDER — BUDESONIDE 0.25 MG/2ML IN SUSP
0.2500 mg | Freq: Two times a day (BID) | RESPIRATORY_TRACT | Status: DC
  Administered 2019-02-01: 09:00:00 0.25 mg via RESPIRATORY_TRACT
  Filled 2019-01-31: qty 2

## 2019-01-31 MED ORDER — OXYCODONE-ACETAMINOPHEN 5-325 MG PO TABS: 1.0000 | ORAL_TABLET | Freq: Four times a day (QID) | ORAL | 0 refills | Status: DC | PRN

## 2019-01-31 MED ORDER — ALBUTEROL SULFATE (2.5 MG/3ML) 0.083% IN NEBU: 2.5000 mg | INHALATION_SOLUTION | Freq: Four times a day (QID) | RESPIRATORY_TRACT | Status: DC | PRN

## 2019-01-31 MED ORDER — CHLORHEXIDINE GLUCONATE CLOTH 2 % EX PADS: 6.0000 | MEDICATED_PAD | Freq: Once | CUTANEOUS | Status: DC

## 2019-01-31 MED ORDER — 0.9 % SODIUM CHLORIDE (POUR BTL) OPTIME
TOPICAL | Status: DC | PRN
  Administered 2019-01-31: 1000 mL

## 2019-01-31 MED ORDER — OXYCODONE-ACETAMINOPHEN 5-325 MG PO TABS
1.0000 | ORAL_TABLET | Freq: Two times a day (BID) | ORAL | Status: DC | PRN
  Administered 2019-01-31: 1 via ORAL
  Filled 2019-01-31: qty 1

## 2019-01-31 MED ORDER — ONDANSETRON HCL 4 MG/2ML IJ SOLN: 4.0000 mg | Freq: Four times a day (QID) | INTRAMUSCULAR | Status: DC | PRN

## 2019-01-31 MED ORDER — GABAPENTIN 300 MG PO CAPS
300.0000 mg | ORAL_CAPSULE | ORAL | Status: AC
  Administered 2019-01-31: 300 mg via ORAL
  Filled 2019-01-31: qty 1

## 2019-01-31 MED ORDER — BUPIVACAINE-EPINEPHRINE (PF) 0.25% -1:200000 IJ SOLN
INTRAMUSCULAR | Status: AC
  Filled 2019-01-31: qty 30

## 2019-01-31 MED ORDER — ALBUTEROL SULFATE HFA 108 (90 BASE) MCG/ACT IN AERS: 2.0000 | INHALATION_SPRAY | RESPIRATORY_TRACT | Status: DC | PRN

## 2019-01-31 MED ORDER — HEPARIN SODIUM (PORCINE) 5000 UNIT/ML IJ SOLN
5000.0000 [IU] | Freq: Three times a day (TID) | INTRAMUSCULAR | Status: DC
  Administered 2019-02-01: 5000 [IU] via SUBCUTANEOUS
  Filled 2019-01-31: qty 1

## 2019-01-31 MED ORDER — LACTATED RINGERS IV SOLN
INTRAVENOUS | Status: DC
  Administered 2019-01-31 (×2): via INTRAVENOUS

## 2019-01-31 MED ORDER — ALBUTEROL SULFATE HFA 108 (90 BASE) MCG/ACT IN AERS
INHALATION_SPRAY | RESPIRATORY_TRACT | Status: AC
  Filled 2019-01-31: qty 13.4

## 2019-01-31 MED ORDER — CELECOXIB 200 MG PO CAPS
200.0000 mg | ORAL_CAPSULE | ORAL | Status: AC
  Administered 2019-01-31: 200 mg via ORAL
  Filled 2019-01-31: qty 1

## 2019-01-31 MED ORDER — METFORMIN HCL 500 MG PO TABS
500.0000 mg | ORAL_TABLET | Freq: Two times a day (BID) | ORAL | Status: DC
  Administered 2019-01-31 – 2019-02-01 (×2): 500 mg via ORAL
  Filled 2019-01-31 (×2): qty 1

## 2019-01-31 MED ORDER — FENTANYL CITRATE (PF) 100 MCG/2ML IJ SOLN
25.0000 ug | INTRAMUSCULAR | Status: DC | PRN
  Administered 2019-01-31: 25 ug via INTRAVENOUS
  Administered 2019-01-31: 50 ug via INTRAVENOUS
  Administered 2019-01-31: 25 ug via INTRAVENOUS
  Administered 2019-01-31: 50 ug via INTRAVENOUS

## 2019-01-31 MED ORDER — ALBUTEROL SULFATE (2.5 MG/3ML) 0.083% IN NEBU
INHALATION_SOLUTION | RESPIRATORY_TRACT | Status: AC
  Filled 2019-01-31: qty 3

## 2019-01-31 SURGICAL SUPPLY — 36 items
APPLIER CLIP 5 13 M/L LIGAMAX5 (MISCELLANEOUS) ×4
BLADE CLIPPER SURG (BLADE) IMPLANT
CANISTER SUCT 3000ML PPV (MISCELLANEOUS) ×4 IMPLANT
CATH REDDICK CHOLANGI 4FR 50CM (CATHETERS) ×4 IMPLANT
CHLORAPREP W/TINT 26 (MISCELLANEOUS) ×4 IMPLANT
CLIP APPLIE 5 13 M/L LIGAMAX5 (MISCELLANEOUS) ×2 IMPLANT
COVER MAYO STAND STRL (DRAPES) ×4 IMPLANT
COVER SURGICAL LIGHT HANDLE (MISCELLANEOUS) ×4 IMPLANT
COVER WAND RF STERILE (DRAPES) ×4 IMPLANT
DERMABOND ADVANCED (GAUZE/BANDAGES/DRESSINGS) ×2
DERMABOND ADVANCED .7 DNX12 (GAUZE/BANDAGES/DRESSINGS) ×2 IMPLANT
DRAPE C-ARM 42X72 X-RAY (DRAPES) ×4 IMPLANT
ELECT REM PT RETURN 9FT ADLT (ELECTROSURGICAL) ×4
ELECTRODE REM PT RTRN 9FT ADLT (ELECTROSURGICAL) ×2 IMPLANT
GLOVE BIO SURGEON STRL SZ7.5 (GLOVE) ×4 IMPLANT
GOWN STRL REUS W/ TWL LRG LVL3 (GOWN DISPOSABLE) ×6 IMPLANT
GOWN STRL REUS W/TWL LRG LVL3 (GOWN DISPOSABLE) ×6
IV CATH 14GX2 1/4 (CATHETERS) ×4 IMPLANT
KIT BASIN OR (CUSTOM PROCEDURE TRAY) ×4 IMPLANT
KIT TURNOVER KIT B (KITS) ×4 IMPLANT
NS IRRIG 1000ML POUR BTL (IV SOLUTION) ×4 IMPLANT
PAD ARMBOARD 7.5X6 YLW CONV (MISCELLANEOUS) ×4 IMPLANT
POUCH SPECIMEN RETRIEVAL 10MM (ENDOMECHANICALS) ×4 IMPLANT
SCISSORS LAP 5X35 DISP (ENDOMECHANICALS) ×4 IMPLANT
SET IRRIG TUBING LAPAROSCOPIC (IRRIGATION / IRRIGATOR) ×4 IMPLANT
SET TUBE SMOKE EVAC HIGH FLOW (TUBING) ×4 IMPLANT
SET WALTER ACTIVATION W/DRAPE (SET/KITS/TRAYS/PACK) ×4 IMPLANT
SLEEVE ENDOPATH XCEL 5M (ENDOMECHANICALS) ×8 IMPLANT
SPECIMEN JAR SMALL (MISCELLANEOUS) ×4 IMPLANT
SUT MNCRL AB 4-0 PS2 18 (SUTURE) ×4 IMPLANT
TOWEL GREEN STERILE (TOWEL DISPOSABLE) ×4 IMPLANT
TOWEL GREEN STERILE FF (TOWEL DISPOSABLE) ×4 IMPLANT
TRAY LAPAROSCOPIC MC (CUSTOM PROCEDURE TRAY) ×4 IMPLANT
TROCAR XCEL BLUNT TIP 100MML (ENDOMECHANICALS) ×4 IMPLANT
TROCAR XCEL NON-BLD 5MMX100MML (ENDOMECHANICALS) ×4 IMPLANT
WATER STERILE IRR 1000ML POUR (IV SOLUTION) ×4 IMPLANT

## 2019-01-31 NOTE — Anesthesia Procedure Notes (Signed)
Procedure Name: Intubation Date/Time: 01/31/2019 8:35 AM Performed by: Leonor Liv, CRNA Pre-anesthesia Checklist: Patient identified, Emergency Drugs available, Suction available and Patient being monitored Patient Re-evaluated:Patient Re-evaluated prior to induction Oxygen Delivery Method: Circle System Utilized Preoxygenation: Pre-oxygenation with 100% oxygen Induction Type: IV induction Ventilation: Mask ventilation without difficulty Laryngoscope Size: Glidescope and 4 Grade View: Grade I Tube type: Oral Tube size: 7.0 mm Number of attempts: 1 Airway Equipment and Method: Stylet and Oral airway Placement Confirmation: ETT inserted through vocal cords under direct vision,  positive ETCO2 and breath sounds checked- equal and bilateral Secured at: 21 cm Tube secured with: Tape Dental Injury: Teeth and Oropharynx as per pre-operative assessment

## 2019-01-31 NOTE — Progress Notes (Signed)
Pt admitted to 5w07 from PACU. Pt a/o X 4, currently on 2L [place], VSS, showing know sign of distress. Skin assessment completed, no skin breakdown noted. RN to continue to monitor.

## 2019-01-31 NOTE — Anesthesia Postprocedure Evaluation (Signed)
Anesthesia Post Note  Patient: [place]  Procedure(s) Performed: Laparoscopic Cholecystectomy (N/A Abdomen)     Patient location during evaluation: PACU Anesthesia Type: General Level of consciousness: awake and alert Pain management: pain level controlled Vital Signs Assessment: post-procedure vital signs reviewed and stable Respiratory status: spontaneous breathing and nonlabored ventilation Cardiovascular status: blood pressure returned to baseline and stable Postop Assessment: no apparent nausea or vomiting Anesthetic complications: yes Anesthetic complication details: respiratory eventComments: Pt was bronchospastic with wheezing and decreased O2 sats after extubation, despite being awake and alert with good respiratory rate and effort. She received albuterol nebulizer but sats remained low 80s. She was placed on BiPAP which significantly improved sats. After some time on BiPAP we were able to wean to room air with sats in low to mid 90s.    Last Vitals:  Vitals:   01/31/19 1045 01/31/19 1050  BP:    Pulse: 96 95  Resp: 19 19  Temp:    SpO2:      Last Pain:  Vitals:   01/31/19 1015  PainSc: 8                  Ejay Lashley E Keanen Dohse

## 2019-01-31 NOTE — Op Note (Signed)
01/31/2019  9:49 AM  PATIENT:  Tina Grant  28 y.o. female  PRE-OPERATIVE DIAGNOSIS:  GALLSTONES  POST-OPERATIVE DIAGNOSIS:  GALLSTONES  PROCEDURE:  Procedure(s): Laparoscopic Cholecystectomy (N/A)  SURGEON:  Surgeon(s) and Role:    * Griselda Mineroth, Paul III, MD - Primary  PHYSICIAN ASSISTANT:   ASSISTANTS: Myrtie SomanSharon Hitchcock, RNFA   ANESTHESIA:   local and general  EBL:  minimal   BLOOD ADMINISTERED:none  DRAINS: none   LOCAL MEDICATIONS USED:  MARCAINE     SPECIMEN:  Source of Specimen:  gallbladder  DISPOSITION OF SPECIMEN:  PATHOLOGY  COUNTS:  YES  TOURNIQUET:  * No tourniquets in log *  DICTATION: .Dragon Dictation     Procedure: After informed consent was obtained the patient was brought to the operating room and placed in the supine position on the operating room table. After adequate induction of general anesthesia the patient's abdomen was prepped with ChloraPrep allowed to dry and draped in usual sterile manner. An appropriate timeout was performed. The area below the umbilicus was infiltrated with quarter percent  Marcaine. A small incision was made with a 15 blade knife. The incision was carried down through the subcutaneous tissue bluntly with a hemostat and [employer] retractors. The linea alba was identified. The linea alba was incised with a 15 blade knife and each side was grasped with Coker clamps. The preperitoneal space was then probed with a hemostat until the peritoneum was opened and access was gained to the abdominal cavity. A 0 Vicryl pursestring stitch was placed in the fascia surrounding the opening. A Hassan cannula was then placed through the opening and anchored in place with the previously placed Vicryl purse string stitch. The abdomen was insufflated with carbon dioxide without difficulty. A [facility]oscope was [facility] in the right upper quadrant was inspected. Next the epigastric region was infiltrated with % Marcaine. A small  incision was made with a 15 blade knife. A 5 mm port was placed bluntly through this incision into the abdominal cavity under direct vision. Next 2 sites were chosen laterally on the right side of the abdomen for placement of 5 mm ports. Each of these areas was infiltrated with quarter percent Marcaine. Small stab incisions were made with a 15 blade knife. 5 mm ports were then placed bluntly through these incisions into the abdominal cavity under direct vision without difficulty. A blunt grasper was placed through the lateralmost 5 mm port and used to grasp the dome of the gallbladder and elevated anteriorly and superiorly. Another blunt grasper was placed through the other 5 mm port and used to retract the body and neck of the gallbladder. A dissector was placed through the epigastric port and using the electrocautery the peritoneal reflection at the gallbladder neck was opened. Blunt dissection was then carried out in this area until the gallbladder neck-cystic duct junction was readily identified and a good critical window was created. 3 clips were placed proximally on the cystic duct and one distally and the duct was divided between the 2 sets of clips. Posterior to this the cystic artery was identified and again dissected bluntly in a circumferential manner until a good window  was created. 2 clips were placed proximally and one distally on the artery and the artery was divided between the 2 sets of clips. Next a laparoscopic hook cautery device was used to separate the gallbladder from the liver bed. Prior to completely detaching the gallbladder from the liver bed the liver bed was inspected  and several small bleeding points were coagulated with the electrocautery until the area was completely hemostatic. The gallbladder was then detached the rest of it from the liver bed without difficulty. A laparoscopic bag was inserted through the hassan port. The laparoscope was moved to the epigastric port. The  gallbladder was placed within the bag and the bag was sealed.  The bag with the gallbladder was then removed with the [facility] cannula through the infraumbilical port without difficulty. The fascial defect was then closed with the previously placed Vicryl pursestring stitch as well as with another figure-of-eight 0 Vicryl stitch. The liver bed was inspected again and found to be hemostatic. The abdomen was irrigated with copious amounts of saline until the effluent was clear. The ports were then removed under direct vision without difficulty and were found to be hemostatic. The gas was allowed to escape. The skin incisions were all closed with interrupted 4-0 Monocryl subcuticular stitches. Dermabond dressings were applied. The patient tolerated the procedure well. At the end of the case all needle sponge and instrument counts were correct. The patient was then awakened and taken to recovery in stable condition  PLAN OF CARE: Discharge to home after PACU  PATIENT DISPOSITION:  PACU - hemodynamically stable.   Delay start of Pharmacological VTE agent (>24hrs) due to surgical blood loss or risk of bleeding: not applicable

## 2019-01-31 NOTE — Transfer of Care (Signed)
Immediate Anesthesia Transfer of Care Note  Patient: Tina Grant  Procedure(s) Performed: Laparoscopic Cholecystectomy (N/A Abdomen)  Patient Location: PACU  Anesthesia Type:General  Level of Consciousness: awake, alert  and oriented  Airway & Oxygen Therapy: Patient Spontanous Breathing, Patient connected to face mask oxygen and patient placed on nebulizer for shortness of breath  Post-op Assessment: Report given to RN, Post -op Vital signs reviewed and stable and Patient moving all extremities  Post vital signs: Reviewed and stable  Last Vitals:  Vitals Value Taken Time  BP 139/85 01/31/19 1007  Temp    Pulse 111 01/31/19 1013  Resp 15 01/31/19 1013  SpO2 83 % 01/31/19 1013  Vitals shown include unvalidated device data.  Last Pain:  Vitals:   01/31/19 0753  PainSc: 0-No pain         Complications: No apparent anesthesia complications RT called for CPAP

## 2019-01-31 NOTE — Interval H&P Note (Signed)
History and Physical Interval Note:  01/31/2019 8:15 AM  Tina Grant  has presented today for surgery, with the diagnosis of GALLSTONES.  The various methods of treatment have been discussed with the patient and family. After consideration of risks, benefits and other options for treatment, the patient has consented to  Procedure(s): LAPAROSCOPIC CHOLECYSTECTOMY WITH INTRAOPERATIVE CHOLANGIOGRAM (N/A) as a surgical intervention.  The patient's history has been reviewed, patient examined, no change in status, stable for surgery.  I have reviewed the patient's chart and labs.  Questions were answered to the patient's satisfaction.     [name]

## 2019-01-31 NOTE — H&P (Signed)
Tina Grant  [facility] Patient #: [id number] DOB: 09/13/1990 Single / Language: [place] / Race: Black or African American Female   History of Present Illness The patient is a 28 year old female who presents with abdominal pain. We are asked to see the patient in consultation by Dr. Melene Planan Floyd to evaluate her for gallstones. The patient is a 28 year old black female who had an episode of severe chest and epigastric pain about a week ago. The pain lasted for about 3 days. The pain was associated with some nausea. Usually the pain is not quite bad and she has some indigestion type symptoms. She was evaluated with ultrasound and the ultrasound did show stones in the gallbladder but no gallbladder wall thickening or ductal dilatation. Her liver functions and pancreatic enzymes were normal.   Past Surgical History  No pertinent past surgical history   Diagnostic Studies History  Colonoscopy  never Mammogram  never Pap Smear  1-5 years ago  Allergies  No Known Drug Allergies   Medication History  oxyCODONE-Acetaminophen (5-325MG  Tablet, Oral) Active. Ondansetron HCl (4MG  Tablet, Oral) Active. ProAir HFA (108 (90 Base)MCG/ACT Aerosol Soln, Inhalation) Active. Flovent HFA (110MCG/ACT Aerosol, Inhalation) Active. Medications Reconciled  Social History  Alcohol use  Occasional alcohol use. Caffeine use  Carbonated beverages, Coffee. No drug use  Tobacco use  Former smoker.  Family History  First Degree Relatives  No pertinent family history   Pregnancy / Birth History  Age at menarche  11 years. Contraceptive History  Contraceptive implant. Gravida  3 Length (months) of breastfeeding  3-6 Maternal age  315-20 Para  3 Regular periods   Other Problems  Asthma     Review of Systems  General Not Present- Appetite Loss, Chills, Fatigue, Fever, Night Sweats, Weight Gain and Weight Loss. Skin Not Present- Change in Wart/Mole,  Dryness, Hives, Jaundice, New Lesions, Non-Healing Wounds, Rash and Ulcer. HEENT Not Present- Earache, Hearing Loss, Hoarseness, Nose Bleed, Oral Ulcers, Ringing in the Ears, Seasonal Allergies, Sinus Pain, Sore Throat, Visual Disturbances, Wears glasses/contact lenses and Yellow Eyes. Respiratory Not Present- Bloody sputum, Chronic Cough, Difficulty Breathing, Snoring and Wheezing. Breast Not Present- Breast Mass, Breast Pain, Nipple Discharge and Skin Changes. Cardiovascular Not Present- Chest Pain, Difficulty Breathing Lying Down, Leg Cramps, Palpitations, Rapid Heart Rate, Shortness of Breath and Swelling of Extremities. Gastrointestinal Present- Abdominal Pain and Indigestion. Not Present- Bloating, Bloody Stool, Change in Bowel Habits, Chronic diarrhea, Constipation, Difficulty Swallowing, Excessive gas, Gets full quickly at meals, Hemorrhoids, Nausea, Rectal Pain and Vomiting. Female Genitourinary Not Present- Frequency, Nocturia, Painful Urination, Pelvic Pain and Urgency. Musculoskeletal Not Present- Back Pain, Joint Pain, Joint Stiffness, Muscle Pain, Muscle Weakness and Swelling of Extremities. Neurological Not Present- Decreased Memory, Fainting, Headaches, Numbness, Seizures, Tingling, Tremor, Trouble walking and Weakness. Psychiatric Not Present- Anxiety, Bipolar, Change in Sleep Pattern, Depression, Fearful and Frequent crying. Endocrine Not Present- Cold Intolerance, Excessive Hunger, Hair Changes, Heat Intolerance, Hot flashes and New Diabetes. Hematology Not Present- Blood Thinners, Easy Bruising, Excessive bleeding, Gland problems, HIV and Persistent Infections.  Vitals  Weight: 374.4 lb Height: 66in Body Surface Area: 2.61 m Body Mass Index: 60.43 kg/m  Temp.: 97.46F (Temporal)  Pulse: 98 (Regular)  BP: 146/86(Sitting, Left Arm, Standard)       Physical Exam  General Mental Status-Alert. General Appearance-Consistent with stated age. Hydration-Well  hydrated. Voice-Normal.  Head and Neck Head-normocephalic, atraumatic with no lesions or palpable masses. Trachea-midline. Thyroid Gland Characteristics - normal size and consistency.  Eye Eyeball - Bilateral-Extraocular movements intact. Sclera/Conjunctiva - Bilateral-No scleral icterus.  Chest and Lung Exam Chest and lung exam reveals -quiet, even and easy respiratory effort with no use of accessory muscles and on auscultation, normal breath sounds, no adventitious sounds and normal vocal resonance. Inspection Chest Wall - Normal. Back - normal.  Cardiovascular Cardiovascular examination reveals -normal heart sounds, regular rate and rhythm with no murmurs and normal pedal pulses bilaterally.  Abdomen Inspection Inspection of the abdomen reveals - No Hernias. Skin - Scar - no surgical scars. Palpation/Percussion Palpation and Percussion of the abdomen reveal - Soft, Non Tender, No Rebound tenderness, No Rigidity (guarding) and No hepatosplenomegaly. Auscultation Auscultation of the abdomen reveals - Bowel sounds normal.  Neurologic Neurologic evaluation reveals -alert and oriented x 3 with no impairment of recent or remote memory. Mental Status-Normal.  Musculoskeletal Normal Exam - Left-Upper Extremity Strength Normal and Lower Extremity Strength Normal. Normal Exam - Right-Upper Extremity Strength Normal and Lower Extremity Strength Normal.  Lymphatic Head & Neck  General Head & Neck Lymphatics: Bilateral - Description - Normal. Axillary  General Axillary Region: Bilateral - Description - Normal. Tenderness - Non Tender. Femoral & Inguinal  Generalized Femoral & Inguinal Lymphatics: Bilateral - Description - Normal. Tenderness - Non Tender.    Assessment & Plan   GALLSTONES (K80.20) Impression: The patient appears to have symptomatic gallstones. Because the risk of further painful episodes and possible pancreatitis I do think she would  benefit from having her gallbladder removed. She would also like to have this done. I have discussed with her in detail the risks and benefits of the operation as well as some of the technical aspects including the risk of bile leak and the risk of duct injury and she understands and wishes to proceed. I will plan for a laparoscopic cholecystectomy with intraoperative cholangiogram  Current Plans Pt Education - Gallstones: discussed with patient and provided information.

## 2019-01-31 NOTE — Addendum Note (Signed)
Addendum  created 01/31/19 1540 by Leonor Liv, CRNA   Intraprocedure Meds edited

## 2019-02-01 ENCOUNTER — Encounter (HOSPITAL_COMMUNITY): Payer: Self-pay | Admitting: General Surgery

## 2019-02-01 DIAGNOSIS — K8010 Calculus of gallbladder with chronic cholecystitis without obstruction: Secondary | ICD-10-CM | POA: Diagnosis not present

## 2019-02-01 LAB — GLUCOSE, CAPILLARY: Glucose-Capillary: 127 mg/dL — ABNORMAL HIGH (ref 70–99)

## 2019-02-01 NOTE — Progress Notes (Signed)
Nsg Discharge Note  Reviewed patient dc paperwork. Patient awaiting transportation.  Admit Date:  01/31/2019 Discharge date: 02/01/2019   Tina Grant to be D/C'd Home per MD order.  AVS completed.  Copy for chart, and copy for patient signed, and dated. Patient/caregiver able to verbalize understanding.  Discharge Medication: Allergies as of 02/01/2019      Reactions   Shellfish Allergy Anaphylaxis, Hives   Iodine       Medication List    STOP taking these medications   ondansetron 4 MG tablet Commonly known as: ZOFRAN     TAKE these medications   ergocalciferol 1.25 MG (50000 UT) capsule Commonly known as: VITAMIN D2 Take 50,000 Units by mouth once a week.   Flovent HFA 110 MCG/ACT inhaler Generic drug: fluticasone Inhale 1 puff into the lungs 2 (two) times a day.   meclizine 25 MG tablet Commonly known as: ANTIVERT Take 1 tablet (25 mg total) by mouth 2 (two) times daily as needed for dizziness.   metFORMIN 500 MG tablet Commonly known as: GLUCOPHAGE Take 500 mg by mouth 2 (two) times daily with a meal.   montelukast 10 MG tablet Commonly known as: SINGULAIR Take 10 mg by mouth at bedtime.   Nexplanon 68 MG Impl implant Generic drug: etonogestrel 68 mg by Subdermal route once.   oxyCODONE-acetaminophen 5-325 MG tablet Commonly known as: PERCOCET/ROXICET Take 1 tablet by mouth every 6 (six) hours as needed for severe pain. What changed: when to take this   albuterol (2.5 MG/3ML) 0.083% nebulizer solution Commonly known as: PROVENTIL Take 2.5 mg by nebulization every 6 (six) hours as needed for wheezing or shortness of breath.   ProAir HFA 108 (90 Base) MCG/ACT inhaler Generic drug: albuterol Inhale 2 puffs into the lungs every 4 (four) hours as needed for wheezing or shortness of breath.       Discharge Assessment: Vitals:   02/01/19 0752 02/01/19 0830  BP: 121/82   Pulse: 92 89  Resp: 18 16  Temp: 97.9 F (36.6 C)   SpO2: 99%    Skin clean,  dry and intact without evidence of skin break down, no evidence of skin tears noted. IV catheter discontinued intact. Site without signs and symptoms of complications - no redness or edema noted at insertion site, patient denies c/o pain - only slight tenderness at site.  Dressing with slight pressure applied.  D/c Instructions-Education: Discharge instructions given to patient/family with verbalized understanding. D/c education completed with patient/family including follow up instructions, medication list, d/c activities limitations if indicated, with other d/c instructions as indicated by MD - patient able to verbalize understanding, all questions fully answered. Patient instructed to return to ED, call 911, or call MD for any changes in condition.  Patient escorted via [place], and D/C home via private auto.  Erasmo Leventhal, RN 02/01/2019 11:31 AM

## 2019-02-01 NOTE — Progress Notes (Signed)
1 Day Post-Op   Subjective/Chief Complaint: No complaints   Objective: Vital signs in last 24 hours: Temp:  [97.2 F (36.2 C)-98.3 F (36.8 C)] 97.9 F (36.6 C) (07/30 0752) Pulse Rate:  [82-109] 92 (07/30 0752) Resp:  [14-30] 18 (07/30 0752) BP: (105-139)/(66-92) 121/82 (07/30 0752) SpO2:  [83 %-100 %] 99 % (07/30 0752) FiO2 (%):  [45 %] 45 % (07/29 1045) Weight:  [172.1 kg] 172.1 kg (07/29 1429) Last BM Date: 01/30/19  Intake/Output from previous day: 07/29 0701 - 07/30 0700 In: 1561.5 [P.O.:236; I.V.:1325.5] Out: 5 [Blood:5] Intake/Output this shift: No intake/output data recorded.  General appearance: alert and cooperative Resp: clear to auscultation bilaterally Cardio: regular rate and rhythm GI: soft, mild tenderness  Lab Results:  No results for input(s): WBC, HGB, HCT, PLT in the last 72 hours. BMET No results for input(s): NA, K, CL, CO2, GLUCOSE, BUN, CREATININE, CALCIUM in the last 72 hours. PT/INR No results for input(s): LABPROT, INR in the last 72 hours. ABG No results for input(s): PHART, HCO3 in the last 72 hours.  Invalid input(s): PCO2, PO2  Studies/Results: No results found.  Anti-infectives: Anti-infectives (From admission, onward)   Start     Dose/Rate Route Frequency Ordered Stop   01/31/19 0700  ceFAZolin (ANCEF) 3 g in dextrose 5 % 50 mL IVPB     3 g 100 mL/hr over 30 Minutes Intravenous To Surgery 01/30/19 1038 01/31/19 0828      Assessment/Plan: s/p Procedure(s): Laparoscopic Cholecystectomy (N/A) Advance diet Discharge  LOS: 0 days    [name] 02/01/2019

## 2019-02-06 ENCOUNTER — Ambulatory Visit: Payer: Medicaid Other

## 2019-02-13 ENCOUNTER — Ambulatory Visit: Payer: Medicaid Other | Admitting: Dietician

## 2019-02-13 ENCOUNTER — Ambulatory Visit: Payer: Medicaid Other

## 2019-02-20 ENCOUNTER — Ambulatory Visit: Payer: Medicaid Other

## 2019-05-10 ENCOUNTER — Encounter (HOSPITAL_COMMUNITY): Payer: Self-pay | Admitting: Student

## 2019-05-10 ENCOUNTER — Emergency Department (HOSPITAL_COMMUNITY)
Admission: EM | Admit: 2019-05-10 | Discharge: 2019-05-10 | Disposition: A | Discharge status: Home / Self Care | Payer: Medicaid Other | Attending: Emergency Medicine | Admitting: Emergency Medicine

## 2019-05-10 ENCOUNTER — Other Ambulatory Visit: Payer: Self-pay

## 2019-05-10 DIAGNOSIS — Z79899 Other long term (current) drug therapy: Secondary | ICD-10-CM | POA: Insufficient documentation

## 2019-05-10 DIAGNOSIS — Z7984 Long term (current) use of oral hypoglycemic drugs: Secondary | ICD-10-CM | POA: Diagnosis not present

## 2019-05-10 DIAGNOSIS — Z87891 Personal history of nicotine dependence: Secondary | ICD-10-CM | POA: Diagnosis not present

## 2019-05-10 DIAGNOSIS — N760 Acute vaginitis: Secondary | ICD-10-CM | POA: Insufficient documentation

## 2019-05-10 DIAGNOSIS — N898 Other specified noninflammatory disorders of vagina: Secondary | ICD-10-CM

## 2019-05-10 DIAGNOSIS — J45909 Unspecified asthma, uncomplicated: Secondary | ICD-10-CM | POA: Insufficient documentation

## 2019-05-10 DIAGNOSIS — B9689 Other specified bacterial agents as the cause of diseases classified elsewhere: Secondary | ICD-10-CM

## 2019-05-10 LAB — URINALYSIS, ROUTINE W REFLEX MICROSCOPIC
Bilirubin Urine: NEGATIVE
Glucose, UA: NEGATIVE mg/dL
Hgb urine dipstick: NEGATIVE
Ketones, ur: NEGATIVE mg/dL
Leukocytes,Ua: NEGATIVE
Nitrite: NEGATIVE
Protein, ur: NEGATIVE mg/dL
Specific Gravity, Urine: 1.014 (ref 1.005–1.030)
pH: 5 (ref 5.0–8.0)

## 2019-05-10 LAB — WET PREP, GENITAL
Sperm: NONE SEEN
Trich, Wet Prep: NONE SEEN
Yeast Wet Prep HPF POC: NONE SEEN

## 2019-05-10 LAB — PREGNANCY, URINE: Preg Test, Ur: NEGATIVE

## 2019-05-10 MED ORDER — METRONIDAZOLE 500 MG PO TABS: 500.0000 mg | ORAL_TABLET | Freq: Two times a day (BID) | ORAL | 0 refills | Status: DC

## 2019-05-10 MED ORDER — FLUCONAZOLE 150 MG PO TABS: ORAL_TABLET | ORAL | 0 refills | Status: DC

## 2019-05-10 NOTE — ED Provider Notes (Signed)
[facility] Provider Note   CSN: [id number] Arrival date & time: 05/10/19  1033     History   Chief Complaint Chief Complaint  Patient presents with  . Vaginal Discharge    HPI Tina Grant is a 28 y.o. female with a hx of anemia, asthma, gestational diabetes, & obesity who presents to the ED with complaints of vaginal discharge x 2 weeks. Patient reports discharge is mild, white, & pruritic. No alleviating/aggravating factors. Tried OTC intra-vaginal yeast tx & received 1 tablet of diflucan from PCP about 1 week ago without relief. Recently on abx, feels like prior yeast infections. Has not been sexually active in > 3 months, she is not concerned for STDs. Denies fever, chills, N/V, pelvic pain, urinary sxs, or vaginal bleeding. LMP about 1 month ago.     HPI  Past Medical History:  Diagnosis Date  . Anemia    history of  . Asthma   . Complication of anesthesia   . Dyspnea   . Gestational diabetes    diet controlled  . Infection    uti  . MRSA infection     Patient Active Problem List   Diagnosis Date Noted  . Gallstones 01/31/2019  . Nexplanon insertion 03/21/2017  . S/P cesarean section 02/13/2017  . Gestational diabetes mellitus (GDM) in third trimester 12/02/2016  . Maternal iron deficiency anemia affecting pregnancy, antepartum, third trimester 12/02/2016  . History of asthma 12/01/2016  . GBS bacteriuria 08/05/2016  . Supervision of high risk pregnancy, antepartum 07/27/2016  . Morbid obesity (HCC) 07/27/2016  . H/O: C-section 07/27/2016    Past Surgical History:  Procedure Laterality Date  . CESAREAN SECTION    . CESAREAN SECTION N/A 02/11/2014  . CESAREAN SECTION N/A 02/13/2017   Procedure: REPEAT CESAREAN SECTION;  Surgeon: Lazaro ArmsEure, Luther H, MD;  [facility];  Service: Obstetrics;  Laterality: N/A;  . CHOLECYSTECTOMY  01/31/2019  . CHOLECYSTECTOMY N/A 01/31/2019   Procedure: Laparoscopic  Cholecystectomy;  Surgeon: Griselda Mineroth, Paul III, MD;  [facility];  Service: General;  Laterality: N/A;     OB History    Gravida  3   Para  3   Term  1   Preterm      AB      Living  3     SAB      TAB      Ectopic      Multiple  0   Live Births  3        Obstetric Comments  NRFHR for 1st C-section 2nd C-section for repeat.         Home Medications    Prior to Admission medications   Medication Sig Start Date End Date Taking? Authorizing Provider  albuterol (PROVENTIL) (2.5 MG/3ML) 0.083% nebulizer solution Take 2.5 mg by nebulization every 6 (six) hours as needed for wheezing or shortness of breath.    [provider]  ergocalciferol (VITAMIN D2) 1.25 MG (50000 UT) capsule Take 50,000 Units by mouth once a week.    [provider]  etonogestrel (NEXPLANON) 68 MG IMPL implant 68 mg by Subdermal route once.    [provider]  FLOVENT HFA 110 MCG/ACT inhaler Inhale 1 puff into the lungs 2 (two) times a day. 11/29/18   [provider]  meclizine (ANTIVERT) 25 MG tablet Take 1 tablet (25 mg total) by mouth 2 (two) times daily as needed for dizziness. 11/17/17   Lennette BihariButler, John Michael,  MD  metFORMIN (GLUCOPHAGE) 500 MG tablet Take 500 mg by mouth 2 (two) times daily with a meal.    [provider]  montelukast (SINGULAIR) 10 MG tablet Take 10 mg by mouth at bedtime.    [provider]  oxyCODONE-acetaminophen (PERCOCET/ROXICET) 5-325 MG tablet Take 1 tablet by mouth every 6 (six) hours as needed for severe pain. 01/31/19   Jovita Kussmaul, MD  PROAIR HFA 108 [phone] Base) MCG/ACT inhaler Inhale 2 puffs into the lungs every 4 (four) hours as needed for wheezing or shortness of breath. 01/12/17   Woodroe Mode, MD    Family History Family History  Problem Relation Age of Onset  . HIV Mother   . Cancer Maternal Grandmother        breast  . Heart disease Maternal Grandfather     Social History Social History   Tobacco  Use  . Smoking status: Former Smoker    Types: Cigarettes  . Smokeless tobacco: Never Used  . Tobacco comment: prior to first preg  Substance Use Topics  . Alcohol use: Yes    Comment: occasional  . Drug use: No     Allergies   Shellfish allergy and Iodine   Review of Systems Review of Systems  Constitutional: Negative for chills and fever.  Respiratory: Negative for stridor.   Cardiovascular: Negative for chest pain.  Gastrointestinal: Negative for abdominal pain, constipation, diarrhea, nausea and vomiting.  Genitourinary: Positive for vaginal discharge. Negative for dysuria, frequency, urgency and vaginal bleeding.  All other systems reviewed and are negative.    Physical Exam Updated Vital Signs BP 115/83 (BP Location: Right Arm)   Pulse 100   Temp 98.1 F (36.7 C) (Oral)   Resp 18   Ht 5\' 6"  (1.676 m)   Wt (!) 160.6 kg   SpO2 100%   BMI 57.14 kg/m   Physical Exam Vitals signs and nursing note reviewed. Exam conducted with a chaperone present.  Constitutional:      General: She is not in acute distress.    Appearance: She is well-developed. She is not toxic-appearing.  HENT:     Head: Normocephalic and atraumatic.  Eyes:     General:        Right eye: No discharge.        Left eye: No discharge.     Conjunctiva/sclera: Conjunctivae normal.  Neck:     Musculoskeletal: Neck supple.  Cardiovascular:     Rate and Rhythm: Normal rate and regular rhythm.  Pulmonary:     Effort: Pulmonary effort is normal. No respiratory distress.     Breath sounds: Normal breath sounds. No wheezing, rhonchi or rales.  Abdominal:     General: There is no distension.     Palpations: Abdomen is soft.     Tenderness: There is no abdominal tenderness. There is no guarding or rebound.  Genitourinary:    Labia:        Right: No lesion.        Left: No lesion.      Cervix: No cervical motion tenderness or friability.     Adnexa:        Right: No mass, tenderness or fullness.          Left: No mass, tenderness or fullness.       Comments: Thick white vaginal discharge noted.   Skin:    General: Skin is warm and dry.     Findings: No rash.  Neurological:  Mental Status: She is alert.     Comments: Clear speech.   Psychiatric:        Behavior: Behavior normal.      ED Treatments / Results  Labs (all labs ordered are listed, but only abnormal results are displayed) Labs Reviewed  WET PREP, GENITAL - Abnormal; Notable for the following components:      Result Value   Clue Cells Wet Prep HPF POC PRESENT (*)    WBC, Wet Prep HPF POC MANY (*)    All other components within normal limits  URINALYSIS, ROUTINE W REFLEX MICROSCOPIC  PREGNANCY, URINE  GC/CHLAMYDIA PROBE AMP ([place]) NOT AT [facility]    EKG None  Radiology No results found.  Procedures Procedures (including critical care time)  Medications Ordered in ED Medications - No data to display   Initial Impression / Assessment and Plan / ED Course  I have reviewed the triage vital signs and the nursing notes.  Pertinent labs & imaging results that were available during my care of the patient were reviewed by me and considered in my medical decision making (see chart for details).   Patient presents to the emergency department with complaints of vaginal discharge/pruritus for the past 2 weeks.  Recently on antibiotics.  Tried OTC intravaginal antifungal as well as one-time dose of Diflucan without relief.  Abdominal/adnexal/cervical motion tenderness to indicate PID.  She is not sexually active and has no concern for STDs, GC/chlamydia pending.  Wet prep without trichomonas. UA w/o UTI. Urine preg negative. Wet prep with BV present, will treat with Flagyl, there is no yeast, however given patient states this feels like prior yeast will provide diflucan to use if no resolution w/ flagyl. I discussed results, treatment plan, need for follow-up, and return precautions with the patient. Provided  opportunity for questions, patient confirmed understanding and is in agreement with plan.    Final Clinical Impressions(s) / ED Diagnoses   Final diagnoses:  BV (bacterial vaginosis)  Vaginal discharge    ED Discharge Orders         Ordered    metroNIDAZOLE (FLAGYL) 500 MG tablet  2 times daily     05/10/19 1341    fluconazole (DIFLUCAN) 150 MG tablet     05/10/19 1341           Mckell Riecke, Pleas Koch, PA-C 05/10/19 1343    Gerhard Munch, MD 05/10/19 1353

## 2019-05-10 NOTE — Discharge Instructions (Signed)
You were seen in the emergency department today for vaginal itching/discharge.  Your pregnancy test was negative.  Your urine was normal.  Your wet prep showed bacterial vaginosis, please see attached handout.  We are treating this with Flagyl, an antibiotic, please take this as prescribed.  Do not drink alcohol when taking Flagyl as it is extremely dangerous.   We have prescribed you new medication(s) today. Discuss the medications prescribed today with your pharmacist as they can have adverse effects and interactions with your other medicines including over the counter and prescribed medications. Seek medical evaluation if you start to experience new or abnormal symptoms after taking one of these medicines, seek care immediately if you start to experience difficulty breathing, feeling of your throat closing, facial swelling, or rash as these could be indications of a more serious allergic reaction  We also send you home with Diflucan, should your vaginal symptoms not resolve after taking Flagyl please take Diflucan.  Take 1 tablet

## 2019-05-10 NOTE — ED Triage Notes (Signed)
Pt states she has been on ABx for H pylori and believes she has a yeast infection.  Has used monistat and  fluconazole prescribed by PMD without relief.  States she has vaginal dc that is thin white without odor or urinary sx.  Not currently sexually active.

## 2019-05-11 LAB — GC/CHLAMYDIA PROBE AMP (~~LOC~~) NOT AT ARMC
Chlamydia: NEGATIVE
Neisseria Gonorrhea: NEGATIVE

## 2019-06-13 ENCOUNTER — Ambulatory Visit (INDEPENDENT_AMBULATORY_CARE_PROVIDER_SITE_OTHER): Payer: Medicaid Other | Admitting: Obstetrics and Gynecology

## 2019-06-13 ENCOUNTER — Encounter: Payer: Self-pay | Admitting: Obstetrics and Gynecology

## 2019-06-13 ENCOUNTER — Other Ambulatory Visit: Payer: Self-pay

## 2019-06-13 ENCOUNTER — Other Ambulatory Visit (HOSPITAL_COMMUNITY)
Admission: RE | Admit: 2019-06-13 | Discharge: 2019-06-13 | Disposition: A | Discharge status: Home / Self Care | Payer: Medicaid Other | Source: Ambulatory Visit | Attending: Obstetrics and Gynecology | Admitting: Obstetrics and Gynecology

## 2019-06-13 VITALS — BP 122/83 | HR 103 | Ht 66.0 in | Wt 350.0 lb

## 2019-06-13 DIAGNOSIS — Z01419 Encounter for gynecological examination (general) (routine) without abnormal findings: Secondary | ICD-10-CM

## 2019-06-13 DIAGNOSIS — Z0000 Encounter for general adult medical examination without abnormal findings: Secondary | ICD-10-CM | POA: Diagnosis not present

## 2019-06-13 DIAGNOSIS — N898 Other specified noninflammatory disorders of vagina: Secondary | ICD-10-CM

## 2019-06-13 DIAGNOSIS — Z3040 Encounter for surveillance of contraceptives, unspecified: Secondary | ICD-10-CM

## 2019-06-13 NOTE — Progress Notes (Signed)
   WELL-WOMAN PHYSICAL Patient name: Tina Grant MRN [id number]  Date of birth: 1990/11/07 Chief Complaint:   Gynecologic Exam  History of Present Illness:   Tina Grant is a 28 y.o. G69P3003 African American female being seen today for a routine well-woman exam.  Current complaints: recent dx of BV and vaginal irritation. Requesting STI testing. She has 1 sexual partner, but was last sexually active ~1 month ago  PCP: [facility]      does desire labs No LMP recorded. (Menstrual status: Other). The current method of family planning is Nexplanon.  Last pap 07/27/2016. Results were: normal Last mammogram: N/A. Results were: n/a. Family h/o breast cancer: No Last colonoscopy: N/A. Results were: n/a. Family h/o colorectal cancer: No Review of Systems:   Pertinent items are noted in HPI Denies any headaches, blurred vision, fatigue, shortness of breath, chest pain, abdominal pain, abnormal vaginal discharge/itching/odor/irritation, problems with periods, bowel movements, urination, or intercourse unless otherwise stated above. Pertinent History Reviewed:  Reviewed past medical,surgical, social and family history.  Reviewed problem list, medications and allergies. Physical Assessment:   Vitals:   06/13/19 0841  BP: 122/83  Pulse: (!) 103  Weight: (!) 350 lb (158.8 kg)  Height: 5\' 6"  (1.676 m)  Body mass index is 56.49 kg/m.        Physical Examination:   General appearance - well appearing, and in no distress  Mental status - alert, oriented to person, place, and time  Psych:  She has a normal mood and affect  Skin - warm and dry, normal color, no suspicious lesions noted  Chest - effort normal, all lung fields clear to auscultation bilaterally  Heart - normal rate and regular rhythm  Neck:  midline trachea, no thyromegaly or nodules  Breasts - breasts appear normal, no suspicious masses, no skin or nipple changes or  axillary nodes  Abdomen - soft,  nontender, nondistended, no masses or organomegaly  Pelvic - VULVA: normal appearing vulva with no masses, tenderness or lesions  VAGINA: normal appearing vagina with normal color and discharge, no lesions  CERVIX: normal appearing cervix without discharge or lesions, no CMT  Thin prep pap is not done   UTERUS: uterus is felt to be normal size, shape, consistency and nontender   ADNEXA: No adnexal masses or tenderness noted.  Rectal - deferred  Extremities:  No swelling or varicosities noted  No results found for this or any previous visit (from the past 24 hour(s)).  Assessment & Plan:  1) Well-Woman Exam without Pap - Pap not due until 2021 - Normal well-woman exam - Sexually active with 1 partner  2) Vaginal irritation - Cervicovaginal ancillary only( [place]),  - HIV antibody,  - Hepatitis B surface antigen,  - RPR,  - Hepatitis C antibody  Encounter for surveillance of contraceptive device - Nexplanon in place -- no complaints - Due for removal 2021    Labs/procedures today: STI testing   Orders Placed This Encounter  Procedures  . HIV antibody  . Hepatitis B surface antigen  . RPR  . Hepatitis C antibody    Meds: No orders of the defined types were placed in this encounter.   Follow-up: Return in about 1 year (around 06/12/2020) for Annual Exam with Pap; Nexplanon Removal/Insertion.  Laury Deep MSN, CNM 06/13/2019 9:05 AM

## 2019-06-13 NOTE — Progress Notes (Signed)
Pt states she was recently treated for BV, now having some vaginal itching-?yeast. Pt would like all std screening.

## 2019-06-14 ENCOUNTER — Other Ambulatory Visit: Payer: Self-pay | Admitting: Obstetrics and Gynecology

## 2019-06-14 DIAGNOSIS — B3731 Acute candidiasis of vulva and vagina: Secondary | ICD-10-CM

## 2019-06-14 DIAGNOSIS — N760 Acute vaginitis: Secondary | ICD-10-CM

## 2019-06-14 DIAGNOSIS — B373 Candidiasis of vulva and vagina: Secondary | ICD-10-CM

## 2019-06-14 LAB — CERVICOVAGINAL ANCILLARY ONLY
Bacterial Vaginitis (gardnerella): POSITIVE — AB
Candida Glabrata: NEGATIVE
Candida Vaginitis: POSITIVE — AB
Chlamydia: NEGATIVE
Comment: NEGATIVE
Comment: NEGATIVE
Comment: NEGATIVE
Comment: NEGATIVE
Comment: NEGATIVE
Comment: NORMAL
Neisseria Gonorrhea: NEGATIVE
Trichomonas: NEGATIVE

## 2019-06-14 LAB — HEPATITIS B SURFACE ANTIGEN: Hepatitis B Surface Ag: NEGATIVE

## 2019-06-14 LAB — HEPATITIS C ANTIBODY: Hep C Virus Ab: <0.1 s/co ratio (ref 0.0–0.9)

## 2019-06-14 LAB — RPR: RPR Ser Ql: NONREACTIVE

## 2019-06-14 LAB — HIV ANTIBODY (ROUTINE TESTING W REFLEX): HIV Screen 4th Generation wRfx: NONREACTIVE

## 2019-06-14 MED ORDER — CLOTRIMAZOLE 1 % VA CREA: 1.0000 | TOPICAL_CREAM | Freq: Every day | VAGINAL | 0 refills | Status: DC

## 2019-06-14 MED ORDER — METRONIDAZOLE 500 MG PO TABS: 500.0000 mg | ORAL_TABLET | Freq: Two times a day (BID) | ORAL | 0 refills | Status: DC

## 2019-07-28 ENCOUNTER — Emergency Department (HOSPITAL_COMMUNITY)
Admission: EM | Admit: 2019-07-28 | Discharge: 2019-07-28 | Disposition: A | Discharge status: Home / Self Care | Payer: Medicaid Other | Attending: Emergency Medicine | Admitting: Emergency Medicine

## 2019-07-28 ENCOUNTER — Other Ambulatory Visit: Payer: Self-pay

## 2019-07-28 ENCOUNTER — Encounter (HOSPITAL_COMMUNITY): Payer: Self-pay | Admitting: Emergency Medicine

## 2019-07-28 DIAGNOSIS — U071 COVID-19: Secondary | ICD-10-CM | POA: Insufficient documentation

## 2019-07-28 DIAGNOSIS — B349 Viral infection, unspecified: Secondary | ICD-10-CM

## 2019-07-28 DIAGNOSIS — M791 Myalgia, unspecified site: Secondary | ICD-10-CM | POA: Diagnosis present

## 2019-07-28 DIAGNOSIS — Z87891 Personal history of nicotine dependence: Secondary | ICD-10-CM | POA: Diagnosis not present

## 2019-07-28 DIAGNOSIS — Z79899 Other long term (current) drug therapy: Secondary | ICD-10-CM | POA: Diagnosis not present

## 2019-07-28 DIAGNOSIS — J45909 Unspecified asthma, uncomplicated: Secondary | ICD-10-CM | POA: Diagnosis not present

## 2019-07-28 NOTE — ED Triage Notes (Signed)
Pt reports loss of taste and smell with body ache and headache. Pt reports symptoms have been occurring for the last several days.

## 2019-07-28 NOTE — ED Provider Notes (Signed)
[facility]-EMERGENCY DEPT Provider Note   CSN: [id number] Arrival date & time: 07/28/19  1922     History Chief Complaint  Patient presents with  . possible covid    Tina Grant is a 29 y.o. female.  Patient is a 29 year old female who presents with possible Covid symptoms.  She has had a 2 to 3-day history of body aches and fatigue.  She has had some chills but no known fevers.  She has had rhinorrhea and congestion with some coughing.  She does have a history of asthma and has had to use her inhaler little bit more frequently.  She does not have a significant increase in shortness of breath other than occasionally when she has to use her inhaler.  She denies any leg swelling.  Recently she lost her sense of taste and smell.  She denies any known Covid exposures.  No vomiting or diarrhea.        Past Medical History:  Diagnosis Date  . Anemia    history of  . Asthma   . Complication of anesthesia   . Dyspnea   . Gestational diabetes    diet controlled  . Infection    uti  . MRSA infection     Patient Active Problem List   Diagnosis Date Noted  . Gallstones 01/31/2019  . Nexplanon insertion 03/21/2017  . S/P cesarean section 02/13/2017  . Gestational diabetes mellitus (GDM) in third trimester 12/02/2016  . Maternal iron deficiency anemia affecting pregnancy, antepartum, third trimester 12/02/2016  . History of asthma 12/01/2016  . GBS bacteriuria 08/05/2016  . Supervision of high risk pregnancy, antepartum 07/27/2016  . Morbid obesity (HCC) 07/27/2016  . H/O: C-section 07/27/2016    Past Surgical History:  Procedure Laterality Date  . CESAREAN SECTION    . CESAREAN SECTION N/A 02/11/2014  . CESAREAN SECTION N/A 02/13/2017   Procedure: REPEAT CESAREAN SECTION;  Surgeon: Lazaro Arms, MD;  Location: [facility] BIRTHING SUITES;  Service: Obstetrics;  Laterality: N/A;  . CHOLECYSTECTOMY  01/31/2019  . CHOLECYSTECTOMY N/A 01/31/2019   Procedure:  Laparoscopic Cholecystectomy;  Surgeon: Griselda Miner, MD;  Location: [facility] OR;  Service: General;  Laterality: N/A;     OB History    Gravida  3   Para  3   Term  1   Preterm      AB      Living  3     SAB      TAB      Ectopic      Multiple  0   Live Births  3        Obstetric Comments  NRFHR for 1st C-section 2nd C-section for repeat.        Family History  Problem Relation Age of Onset  . HIV Mother   . Cancer Maternal Grandmother        breast  . Heart disease Maternal Grandfather     Social History   Tobacco Use  . Smoking status: Former Smoker    Types: Cigarettes  . Smokeless tobacco: Never Used  . Tobacco comment: prior to first preg  Substance Use Topics  . Alcohol use: Yes    Comment: occasional  . Drug use: No    Home Medications Prior to Admission medications   Medication Sig Start Date End Date Taking? Authorizing Provider  albuterol (PROVENTIL) (2.5 MG/3ML) 0.083% nebulizer solution Take 2.5 mg by nebulization every 6 (six) hours as needed for wheezing  or shortness of breath.    [provider]  budesonide-formoterol (SYMBICORT) 80-4.5 MCG/ACT inhaler Inhale 2 puffs into the lungs 2 (two) times daily.    [provider]  clotrimazole (GYNE-LOTRIMIN) 1 % vaginal cream Place 1 Applicatorful vaginally at bedtime. Use once finished with Flagyl treatment 06/14/19   Tina Grant, CNM  etonogestrel (NEXPLANON) 68 MG IMPL implant 68 mg by Subdermal route once.    [provider]  FLOVENT HFA 110 MCG/ACT inhaler Inhale 1 puff into the lungs 2 (two) times a day. 11/29/18   [provider]  metFORMIN (GLUCOPHAGE) 500 MG tablet Take 500 mg by mouth 2 (two) times daily with a meal.    [provider]  metroNIDAZOLE (FLAGYL) 500 MG tablet Take 1 tablet (500 mg total) by mouth 2 (two) times daily. 06/14/19   Tina Grant, CNM  montelukast (SINGULAIR) 10 MG tablet Take 10 mg by mouth at bedtime.     [provider]  PROAIR HFA 108 [phone] Base) MCG/ACT inhaler Inhale 2 puffs into the lungs every 4 (four) hours as needed for wheezing or shortness of breath. 01/12/17   Adam Phenix, MD    Allergies    Shellfish allergy and Iodine  Review of Systems   Review of Systems  Constitutional: Positive for chills and fatigue. Negative for diaphoresis and fever.  HENT: Positive for congestion and rhinorrhea. Negative for sneezing.   Eyes: Negative.   Respiratory: Positive for cough, shortness of breath (Mildly at times but none now) and wheezing. Negative for chest tightness.   Cardiovascular: Negative for chest pain and leg swelling.  Gastrointestinal: Negative for abdominal pain, blood in stool, diarrhea, nausea and vomiting.  Genitourinary: Negative for difficulty urinating, flank pain, frequency and hematuria.  Musculoskeletal: Positive for myalgias. Negative for arthralgias and back pain.  Skin: Negative for rash.  Neurological: Negative for dizziness, speech difficulty, weakness, numbness and headaches.    Physical Exam Updated Vital Signs BP 112/73 (BP Location: Left Arm)   Pulse 100   Temp 98.6 F (37 C) (Oral)   Resp 18   Ht 5\' 6"  (1.676 m)   Wt (!) 156.5 kg   SpO2 100%   BMI 55.68 kg/m   Physical Exam Constitutional:      Appearance: She is well-developed.  HENT:     Head: Normocephalic and atraumatic.  Eyes:     Pupils: Pupils are equal, round, and reactive to light.  Cardiovascular:     Rate and Rhythm: Normal rate and regular rhythm.     Heart sounds: Normal heart sounds.  Pulmonary:     Effort: Pulmonary effort is normal. No respiratory distress.     Breath sounds: Normal breath sounds. No wheezing or rales.  Chest:     Chest wall: No tenderness.  Abdominal:     General: Bowel sounds are normal.     Palpations: Abdomen is soft.     Tenderness: There is no abdominal tenderness. There is no guarding or rebound.  Musculoskeletal:        General: Normal  range of motion.     Cervical back: Normal range of motion and neck supple.  Lymphadenopathy:     Cervical: No cervical adenopathy.  Skin:    General: Skin is warm and dry.     Findings: No rash.  Neurological:     Mental Status: She is alert and oriented to person, place, and time.     ED Results / Procedures / Treatments  Labs (all labs ordered are listed, but only abnormal results are displayed) Labs Reviewed  SARS CORONAVIRUS 2 (TAT 6-24 HRS)    EKG None  Radiology No results found.  Procedures Procedures (including critical care time)  Medications Ordered in ED Medications - No data to display  ED Course  I have reviewed the triage vital signs and the nursing notes.  Pertinent labs & imaging results that were available during my care of the patient were reviewed by me and considered in my medical decision making (see chart for details).    MDM Rules/Calculators/A&P                      Patient presents with viral symptoms concerning for Covid.  Her lungs are clear on exam.  She has a normal oxygen saturation.  Her heart rate was originally 100 but currently is 92 on my exam.  She has no increased work of breathing.  Covid testing was performed and is pending.  She is otherwise well-appearing.  She was given Covid instructions and return precautions.  Tina Grant was evaluated in Emergency Department on 07/28/2019 for the symptoms described in the history of present illness. She was evaluated in the context of the global COVID-19 pandemic, which necessitated consideration that the patient might be at risk for infection with the SARS-CoV-2 virus that causes COVID-19. Institutional protocols and algorithms that pertain to the evaluation of patients at risk for COVID-19 are in a state of rapid change based on information released by regulatory bodies including the CDC and federal and state organizations. These policies and algorithms were followed during the patient's care  in the ED.  Final Clinical Impression(s) / ED Diagnoses Final diagnoses:  Viral syndrome    Rx / DC Orders ED Discharge Orders    None       Malvin Johns, MD 07/28/19 2216

## 2019-07-29 LAB — SARS CORONAVIRUS 2 (TAT 6-24 HRS): SARS Coronavirus 2: POSITIVE — AB

## 2019-07-30 ENCOUNTER — Other Ambulatory Visit: Payer: Self-pay | Admitting: Pulmonary Disease

## 2019-07-30 ENCOUNTER — Telehealth: Payer: Self-pay | Admitting: Pulmonary Disease

## 2019-07-30 DIAGNOSIS — U071 COVID-19: Secondary | ICD-10-CM

## 2019-07-30 NOTE — Telephone Encounter (Signed)
07/29/2018 0905  I connected by phone with Tina Grant on 07/30/2019 at 9:19 AM to discuss the potential use of an new treatment for mild to moderate COVID-19 viral infection in non-hospitalized patients.  This patient is a 28 y.o. female that meets the FDA criteria for Emergency Use Authorization of bamlanivimab or casirivimab\imdevimab.  Has a (+) direct SARS-CoV-2 viral test result  Has mild or moderate COVID-19   Is ? 29 years of age and weighs ? 40 kg  Is NOT hospitalized due to COVID-19  Is NOT requiring oxygen therapy or requiring an increase in baseline oxygen flow rate due to COVID-19  Is within 10 days of symptom onset  Has at least one of the high risk factor(s) for progression to severe COVID-19 and/or hospitalization as defined in EUA.  Specific high risk criteria : BMI >/= 35   Patient reports that symptom onset was 07/25/2019.  She had a loss of taste and smell, she has a dry cough, nasal congestion.  Patient denies fevers.  Denies worsening shortness of breath.  She has multiple Covid exposures with her family that also were presented to the emergency room at the same time.  I have spoken and communicated the following to the patient or parent/caregiver:  1. FDA has authorized the emergency use of bamlanivimab and casirivimab\imdevimab for the treatment of mild to moderate COVID-19 in adults and pediatric patients with positive results of direct SARS-CoV-2 viral testing who are 12 years of age and older weighing at least 40 kg, and who are at high risk for progressing to severe COVID-19 and/or hospitalization.  2. The significant known and potential risks and benefits of bamlanivimab and casirivimab\imdevimab, and the extent to which such potential risks and benefits are unknown.  3. Information on available alternative treatments and the risks and benefits of those alternatives, including clinical trials.  4. Patients treated with bamlanivimab and casirivimab\imdevimab  should continue to self-isolate and use infection control measures (e.g., wear mask, isolate, social distance, avoid sharing personal items, clean and disinfect "high touch" surfaces, and frequent handwashing) according to CDC guidelines.   5. The patient or parent/caregiver has the option to accept or refuse bamlanivimab or casirivimab\imdevimab .  After reviewing this information with the patient, The patient agreed to proceed with receiving the bamlanimivab infusion and will be provided a copy of the Fact sheet prior to receiving the infusion.   Patient contacted and scheduled for the infusion on 08/02/2019 at 1430.  Directions given.   Reviewed with patient that if symptoms would worsen such as sudden increased shortness of breath, chest pain she needs to seek emergent evaluation and she would no longer be a candidate for the infusion.  Also encouraged patient to discuss this infusion with family members that they were interested they could contact their primary care for referral so that we could follow-up with them.  [name] P Karlyn Glasco 07/30/2019 9:19 AM   

## 2019-07-30 NOTE — Progress Notes (Signed)
07/29/2018 0905  I connected by phone with Tina Grant on 07/30/2019 at 9:19 AM to discuss the potential use of an new treatment for mild to moderate COVID-19 viral infection in non-hospitalized patients.  This patient is a 29 y.o. female that meets the FDA criteria for Emergency Use Authorization of bamlanivimab or casirivimab\imdevimab.  Has a (+) direct SARS-CoV-2 viral test result  Has mild or moderate COVID-19   Is ? 29 years of age and weighs ? 40 kg  Is NOT hospitalized due to COVID-19  Is NOT requiring oxygen therapy or requiring an increase in baseline oxygen flow rate due to COVID-19  Is within 10 days of symptom onset  Has at least one of the high risk factor(s) for progression to severe COVID-19 and/or hospitalization as defined in EUA.  Specific high risk criteria : BMI >/= 35   Patient reports that symptom onset was 07/25/2019.  She had a loss of taste and smell, she has a dry cough, nasal congestion.  Patient denies fevers.  Denies worsening shortness of breath.  She has multiple Covid exposures with her family that also were presented to the emergency room at the same time.  I have spoken and communicated the following to the patient or parent/caregiver:  1. FDA has authorized the emergency use of bamlanivimab and casirivimab\imdevimab for the treatment of mild to moderate COVID-19 in adults and pediatric patients with positive results of direct SARS-CoV-2 viral testing who are 64 years of age and older weighing at least 40 kg, and who are at high risk for progressing to severe COVID-19 and/or hospitalization.  2. The significant known and potential risks and benefits of bamlanivimab and casirivimab\imdevimab, and the extent to which such potential risks and benefits are unknown.  3. Information on available alternative treatments and the risks and benefits of those alternatives, including clinical trials.  4. Patients treated with bamlanivimab and casirivimab\imdevimab  should continue to self-isolate and use infection control measures (e.g., wear mask, isolate, social Grant, avoid sharing personal items, clean and disinfect "high touch" surfaces, and frequent handwashing) according to CDC guidelines.   5. The patient or parent/caregiver has the option to accept or refuse bamlanivimab or casirivimab\imdevimab .  After reviewing this information with the patient, The patient agreed to proceed with receiving the bamlanimivab infusion and will be provided a copy of the Fact sheet prior to receiving the infusion.   Patient contacted and scheduled for the infusion on 08/02/2019 at 1430.  Directions given.   Reviewed with patient that if symptoms would worsen such as sudden increased shortness of breath, chest pain she needs to seek emergent evaluation and she would no longer be a candidate for the infusion.  Also encouraged patient to discuss this infusion with family members that they were interested they could contact their primary care for referral so that we could follow-up with them.  Coral Ceo 07/30/2019 9:19 AM

## 2019-08-02 ENCOUNTER — Ambulatory Visit (HOSPITAL_COMMUNITY): Payer: Medicaid Other

## 2019-11-19 ENCOUNTER — Emergency Department (HOSPITAL_COMMUNITY)
Admission: EM | Admit: 2019-11-19 | Discharge: 2019-11-19 | Disposition: A | Discharge status: Home / Self Care | Payer: Medicaid Other | Attending: Emergency Medicine | Admitting: Emergency Medicine

## 2019-11-19 ENCOUNTER — Encounter (HOSPITAL_COMMUNITY): Payer: Self-pay

## 2019-11-19 ENCOUNTER — Other Ambulatory Visit: Payer: Self-pay

## 2019-11-19 DIAGNOSIS — Z5321 Procedure and treatment not carried out due to patient leaving prior to being seen by health care provider: Secondary | ICD-10-CM | POA: Insufficient documentation

## 2019-11-19 DIAGNOSIS — R0602 Shortness of breath: Secondary | ICD-10-CM | POA: Insufficient documentation

## 2019-11-19 DIAGNOSIS — R350 Frequency of micturition: Secondary | ICD-10-CM | POA: Insufficient documentation

## 2019-11-19 LAB — URINALYSIS, ROUTINE W REFLEX MICROSCOPIC
Bacteria, UA: NONE SEEN
Bilirubin Urine: NEGATIVE
Glucose, UA: NEGATIVE mg/dL
Hgb urine dipstick: NEGATIVE
Ketones, ur: NEGATIVE mg/dL
Nitrite: NEGATIVE
Protein, ur: NEGATIVE mg/dL
Specific Gravity, Urine: 1.009 (ref 1.005–1.030)
pH: 8 (ref 5.0–8.0)

## 2019-11-19 LAB — PREGNANCY, URINE: Preg Test, Ur: NEGATIVE

## 2019-11-19 NOTE — ED Notes (Signed)
Pt called  x3 in front lobby  For room #1 . PA aware

## 2019-11-19 NOTE — ED Triage Notes (Signed)
Pt reports asthma exacerbation, has been using nebulizer more at home, out of albuterol inhaler. Pt also reports urinary frequency. Resp e.u at this time.

## 2019-11-20 ENCOUNTER — Encounter (HOSPITAL_COMMUNITY): Payer: Self-pay | Admitting: Emergency Medicine

## 2019-11-20 ENCOUNTER — Other Ambulatory Visit: Payer: Self-pay

## 2019-11-20 ENCOUNTER — Emergency Department (HOSPITAL_COMMUNITY)
Admission: EM | Admit: 2019-11-20 | Discharge: 2019-11-20 | Disposition: A | Discharge status: Home / Self Care | Payer: Medicaid Other | Attending: Emergency Medicine | Admitting: Emergency Medicine

## 2019-11-20 DIAGNOSIS — J4520 Mild intermittent asthma, uncomplicated: Secondary | ICD-10-CM | POA: Diagnosis not present

## 2019-11-20 DIAGNOSIS — Z87891 Personal history of nicotine dependence: Secondary | ICD-10-CM | POA: Insufficient documentation

## 2019-11-20 DIAGNOSIS — N390 Urinary tract infection, site not specified: Secondary | ICD-10-CM | POA: Diagnosis not present

## 2019-11-20 DIAGNOSIS — Z8709 Personal history of other diseases of the respiratory system: Secondary | ICD-10-CM

## 2019-11-20 DIAGNOSIS — R350 Frequency of micturition: Secondary | ICD-10-CM | POA: Diagnosis present

## 2019-11-20 DIAGNOSIS — Z79899 Other long term (current) drug therapy: Secondary | ICD-10-CM | POA: Diagnosis not present

## 2019-11-20 DIAGNOSIS — Z7984 Long term (current) use of oral hypoglycemic drugs: Secondary | ICD-10-CM | POA: Diagnosis not present

## 2019-11-20 LAB — URINE CULTURE

## 2019-11-20 MED ORDER — FLUCONAZOLE 200 MG PO TABS: 200.0000 mg | ORAL_TABLET | Freq: Once | ORAL | 0 refills | Status: AC

## 2019-11-20 MED ORDER — ALBUTEROL SULFATE (2.5 MG/3ML) 0.083% IN NEBU: 2.5000 mg | INHALATION_SOLUTION | Freq: Four times a day (QID) | RESPIRATORY_TRACT | 0 refills | Status: AC | PRN

## 2019-11-20 MED ORDER — CEPHALEXIN 500 MG PO CAPS: 500.0000 mg | ORAL_CAPSULE | Freq: Three times a day (TID) | ORAL | 0 refills | Status: AC

## 2019-11-20 MED ORDER — PROAIR HFA 108 (90 BASE) MCG/ACT IN AERS: 2.0000 | INHALATION_SPRAY | RESPIRATORY_TRACT | 99 refills | Status: AC | PRN

## 2019-11-20 NOTE — ED Triage Notes (Signed)
Pt reports being seen here yesterday but LWBS. Endorses urinary frequency. Denies pain when urinating or blood in urine.

## 2019-11-20 NOTE — ED Provider Notes (Signed)
[facility] EMERGENCY DEPARTMENT Provider Note   CSN: [id number] Arrival date & time: 11/20/19  0746     History No chief complaint on file.   Tina Grant is a 29 y.o. female.  Patient with history of asthma controlled, anemia presents with urinary frequency and mild discomfort with urination for the past few days.  Patient is also used infection history.  No concern for STDs.  No back pain fever vomiting.  Patient's asthma improved however she is out of her medication, no current shortness of breath.        Past Medical History:  Diagnosis Date  . Anemia    history of  . Asthma   . Complication of anesthesia   . Dyspnea   . Gestational diabetes    diet controlled  . Infection    uti  . MRSA infection     Patient Active Problem List   Diagnosis Date Noted  . COVID-19 virus detected 07/30/2019  . Gallstones 01/31/2019  . Nexplanon insertion 03/21/2017  . S/P cesarean section 02/13/2017  . Gestational diabetes mellitus (GDM) in third trimester 12/02/2016  . Maternal iron deficiency anemia affecting pregnancy, antepartum, third trimester 12/02/2016  . History of asthma 12/01/2016  . GBS bacteriuria 08/05/2016  . Supervision of high risk pregnancy, antepartum 07/27/2016  . Morbid obesity (HCC) 07/27/2016  . H/O: C-section 07/27/2016    Past Surgical History:  Procedure Laterality Date  . CESAREAN SECTION    . CESAREAN SECTION N/A 02/11/2014  . CESAREAN SECTION N/A 02/13/2017   Procedure: REPEAT CESAREAN SECTION;  Surgeon: Lazaro Arms, MD;  Location: [facility] BIRTHING SUITES;  Service: Obstetrics;  Laterality: N/A;  . CHOLECYSTECTOMY  01/31/2019  . CHOLECYSTECTOMY N/A 01/31/2019   Procedure: Laparoscopic Cholecystectomy;  Surgeon: Griselda Miner, MD;  Location: [facility] OR;  Service: General;  Laterality: N/A;     OB History    Gravida  3   Para  3   Term  1   Preterm      AB      Living  3     SAB      TAB      Ectopic      Multiple   0   Live Births  3        Obstetric Comments  NRFHR for 1st C-section 2nd C-section for repeat.        Family History  Problem Relation Age of Onset  . HIV Mother   . Cancer Maternal Grandmother        breast  . Heart disease Maternal Grandfather     Social History   Tobacco Use  . Smoking status: Former Smoker    Types: Cigarettes  . Smokeless tobacco: Never Used  . Tobacco comment: prior to first preg  Substance Use Topics  . Alcohol use: Yes    Comment: occasional  . Drug use: No    Home Medications Prior to Admission medications   Medication Sig Start Date End Date Taking? Authorizing Provider  albuterol (PROVENTIL) (2.5 MG/3ML) 0.083% nebulizer solution Take 3 mLs (2.5 mg total) by nebulization every 6 (six) hours as needed for wheezing or shortness of breath. 11/20/19   Blane Ohara, MD  budesonide-formoterol (SYMBICORT) 80-4.5 MCG/ACT inhaler Inhale 2 puffs into the lungs 2 (two) times daily.    [provider]  cephALEXin (KEFLEX) 500 MG capsule Take 1 capsule (500 mg total) by mouth 3 (three) times daily for 5 days. 11/20/19 11/25/19  Elnora Morrison, MD  clotrimazole (GYNE-LOTRIMIN) 1 % vaginal cream Place 1 Applicatorful vaginally at bedtime. Use once finished with Flagyl treatment 06/14/19   Laury Deep, CNM  etonogestrel (NEXPLANON) 68 MG IMPL implant 68 mg by Subdermal route once.    [provider]  FLOVENT HFA 110 MCG/ACT inhaler Inhale 1 puff into the lungs 2 (two) times a day. 11/29/18   [provider]  fluconazole (DIFLUCAN) 200 MG tablet Take 1 tablet (200 mg total) by mouth once for 1 dose. 11/25/19 11/25/19  Elnora Morrison, MD  metFORMIN (GLUCOPHAGE) 500 MG tablet Take 500 mg by mouth 2 (two) times daily with a meal.    [provider]  metroNIDAZOLE (FLAGYL) 500 MG tablet Take 1 tablet (500 mg total) by mouth 2 (two) times daily. 06/14/19   Laury Deep, CNM  montelukast (SINGULAIR) 10 MG tablet Take 10 mg by  mouth at bedtime.    [provider]  PROAIR HFA 108 [phone] Base) MCG/ACT inhaler Inhale 2 puffs into the lungs every 4 (four) hours as needed for wheezing or shortness of breath. 11/20/19   Elnora Morrison, MD    Allergies    Shellfish allergy and Iodine  Review of Systems   Review of Systems  Constitutional: Negative for chills and fever.  HENT: Negative for congestion.   Eyes: Negative for visual disturbance.  Respiratory: Negative for shortness of breath.   Cardiovascular: Negative for chest pain.  Gastrointestinal: Negative for abdominal pain and vomiting.  Genitourinary: Positive for frequency. Negative for dysuria and flank pain.  Musculoskeletal: Negative for back pain, neck pain and neck stiffness.  Skin: Negative for rash.  Neurological: Negative for light-headedness and headaches.    Physical Exam Updated Vital Signs BP 127/86   Pulse (!) 111   Temp 98.6 F (37 C) (Oral)   Resp 18   Ht 5\' 6"  (1.676 m)   Wt (!) 142 kg   SpO2 100%   BMI 50.53 kg/m   Physical Exam Vitals and nursing note reviewed.  Constitutional:      Appearance: She is well-developed. She is not ill-appearing.  HENT:     Head: Normocephalic and atraumatic.  Eyes:     General:        Right eye: No discharge.        Left eye: No discharge.     Conjunctiva/sclera: Conjunctivae normal.  Neck:     Trachea: No tracheal deviation.  Cardiovascular:     Rate and Rhythm: Regular rhythm. Tachycardia present.  Pulmonary:     Effort: Pulmonary effort is normal.     Breath sounds: Wheezing present.  Abdominal:     General: There is no distension.     Palpations: Abdomen is soft.     Tenderness: There is no abdominal tenderness. There is no guarding.  Musculoskeletal:        General: No swelling.     Cervical back: Normal range of motion and neck supple.  Skin:    General: Skin is warm.     Findings: No rash.  Neurological:     General: No focal deficit present.     Mental Status: She is  alert and oriented to person, place, and time.  Psychiatric:        Mood and Affect: Mood normal.     ED Results / Procedures / Treatments   Labs (all labs ordered are listed, but only abnormal results are displayed) Labs Reviewed - No data to display Labs Reviewed -  No data to display  EKG None  Radiology No results found.  Procedures Procedures (including critical care time)  Medications Ordered in ED Medications - No data to display  ED Course  I have reviewed the triage vital signs and the nursing notes.  Pertinent labs & imaging results that were available during my care of the patient were reviewed by me and considered in my medical decision making (see chart for details).    MDM Rules/Calculators/A&P                      Patient presents primarily for urinary symptoms, patient left without being seen yesterday due to weight.  Reviewed medical records and urinalysis results concerning for possible infection with increased WBCs, culture  Pending. Asthma controlled, minimal wheeze, refill of medication.  Results and differential diagnosis were discussed with the patient/parent/guardian. Xrays were independently reviewed by myself.  Close follow up outpatient was discussed, comfortable with the plan.   Medications - No data to display  Vitals:   11/20/19 0749 11/20/19 0756  BP: 127/86   Pulse: (!) 111   Resp: 18   Temp: 98.6 F (37 C)   TempSrc: Oral   SpO2: 100%   Weight: (!) 142.9 kg (!) 142 kg  Height: 5\' 6"  (1.676 m) 5\' 6"  (1.676 m)    Final diagnoses:  Urinary tract infection without hematuria, site unspecified  Mild intermittent asthma without complication    Final Clinical Impression(s) / ED Diagnoses Final diagnoses:  Urinary tract infection without hematuria, site unspecified  Mild intermittent asthma without complication    Rx / DC Orders ED Discharge Orders         Ordered    fluconazole (DIFLUCAN) 200 MG tablet   Once     11/20/19  0851    albuterol (PROVENTIL) (2.5 MG/3ML) 0.083% nebulizer solution  Every 6 hours PRN     11/20/19 0851    PROAIR HFA 108 (90 Base) MCG/ACT inhaler  Every 4 hours PRN     11/20/19 0851    cephALEXin (KEFLEX) 500 MG capsule  3 times daily     11/20/19 0851           11/22/19, MD 11/20/19 [phone]

## 2019-11-20 NOTE — Discharge Instructions (Signed)
Use your albuterol and inhalers as previously prescribed. Return for worsening shortness of breath or new concerns including back pain, vomiting, persistent fevers. Take antibiotics as directed and yeast infection medicine at the end of treatment.

## 2020-03-18 IMAGING — US ULTRASOUND ABDOMEN LIMITED
1 series · 14 of 25 positions shown · non-contrast
Comparison: None.

CLINICAL DATA: Epigastric pain

EXAM:
ULTRASOUND ABDOMEN LIMITED RIGHT UPPER QUADRANT

[Series 1: ultrasound abdomen limited · 14 of 57 slices shown]
[im 1/57]
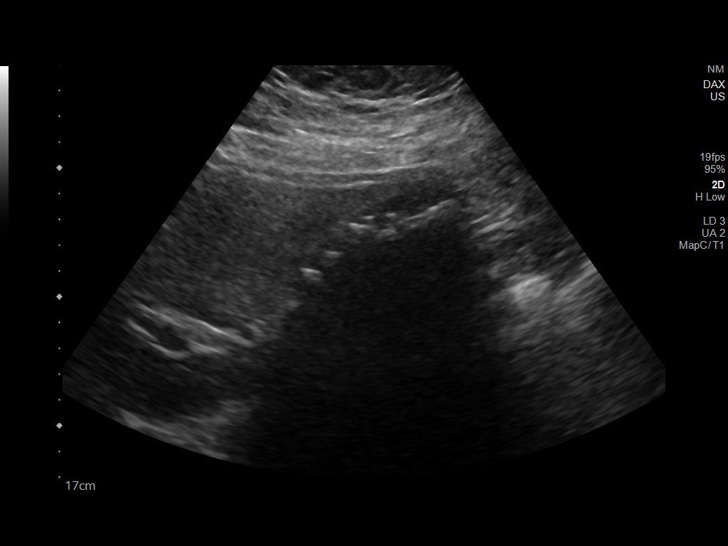
[im 5/57]
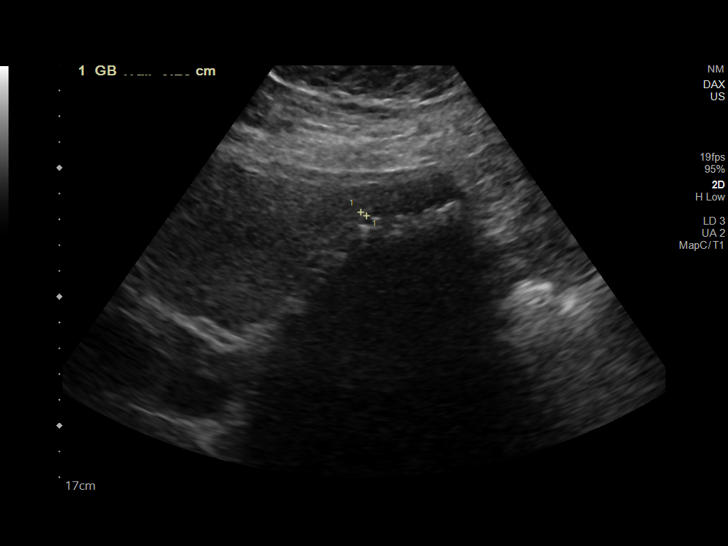
[im 10/57]
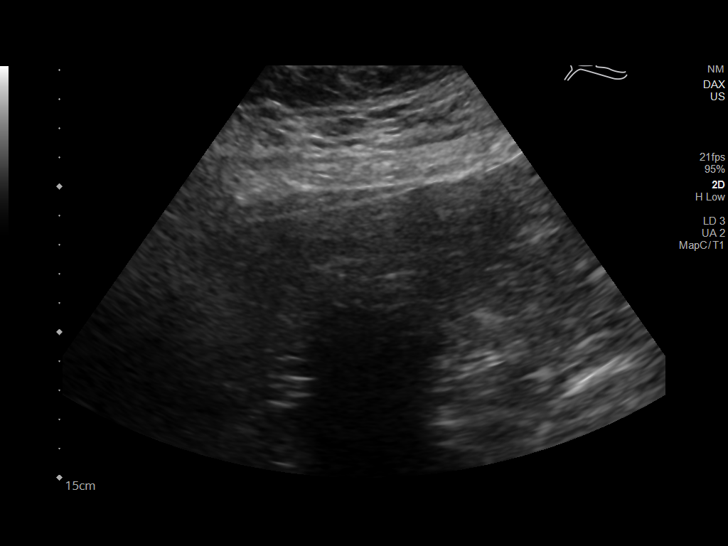
[im 15/57]
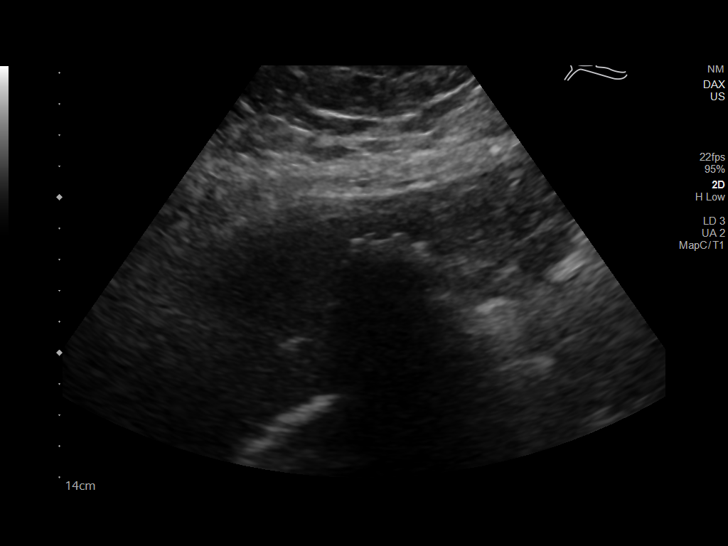
[im 19/57]
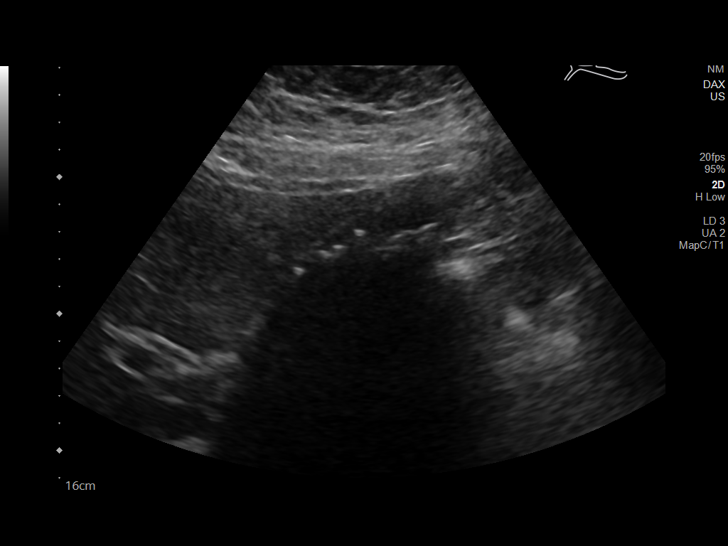
[im 22/57]
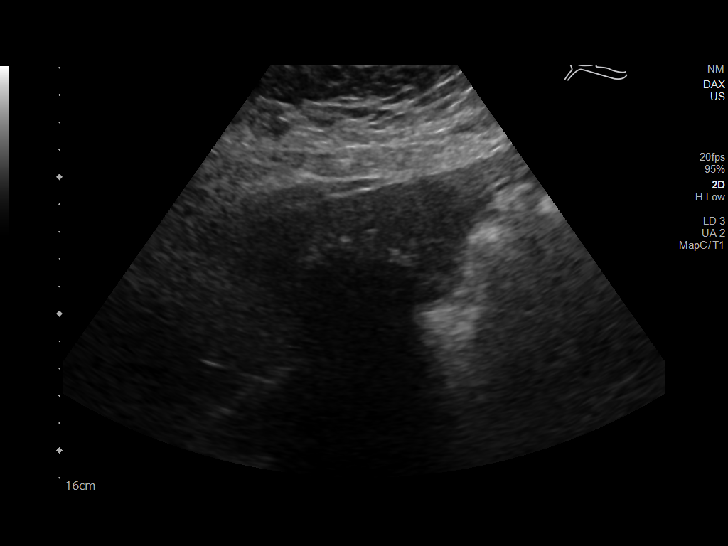
[im 26/57]
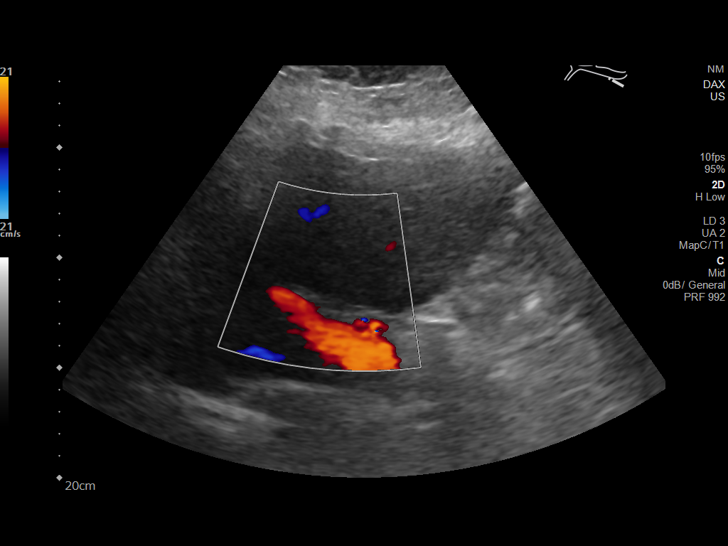
[im 31/57]
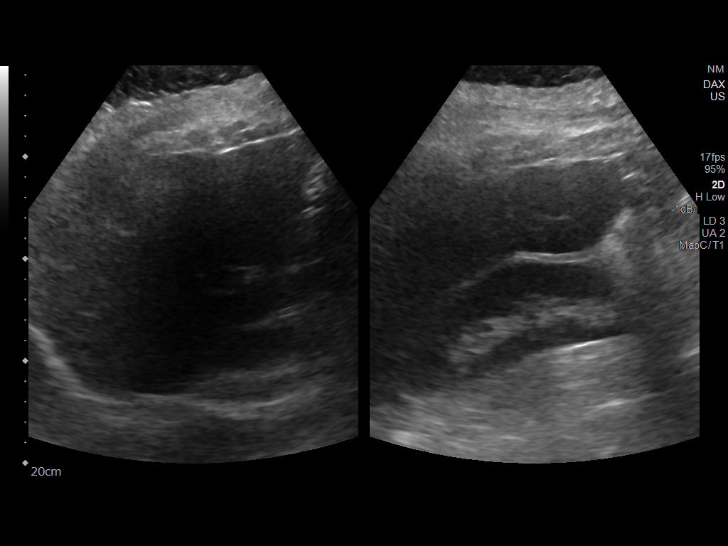
[im 36/57]
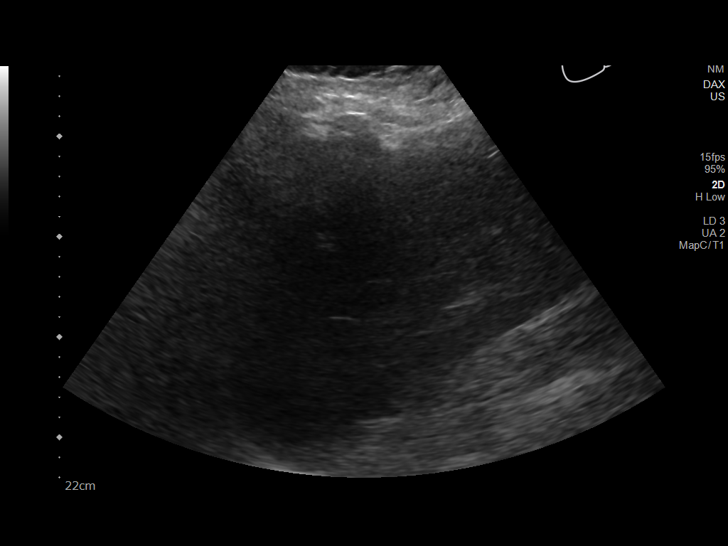
[im 38/57]
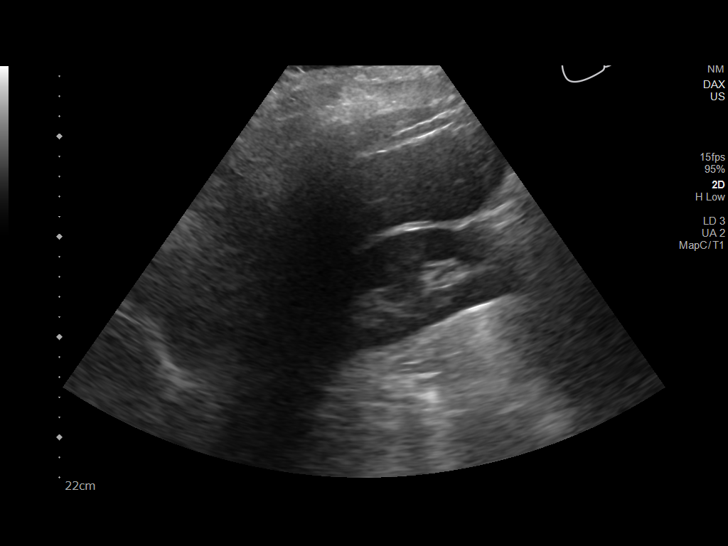
[im 43/57]
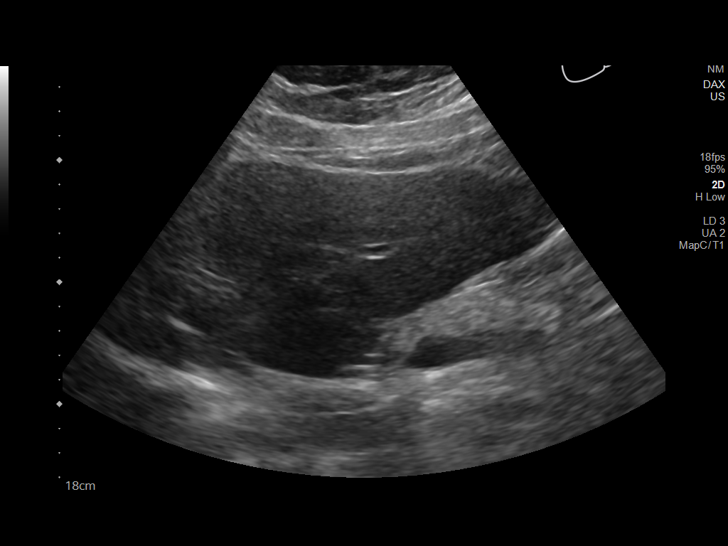
[im 47/57]
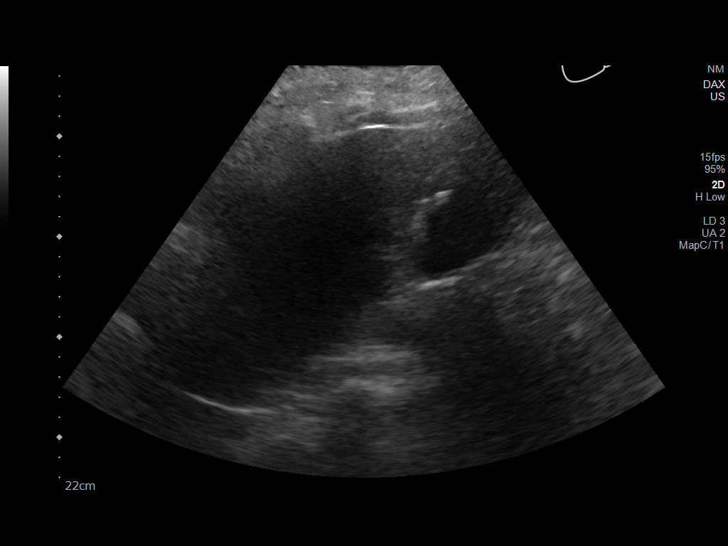
[im 52/57]
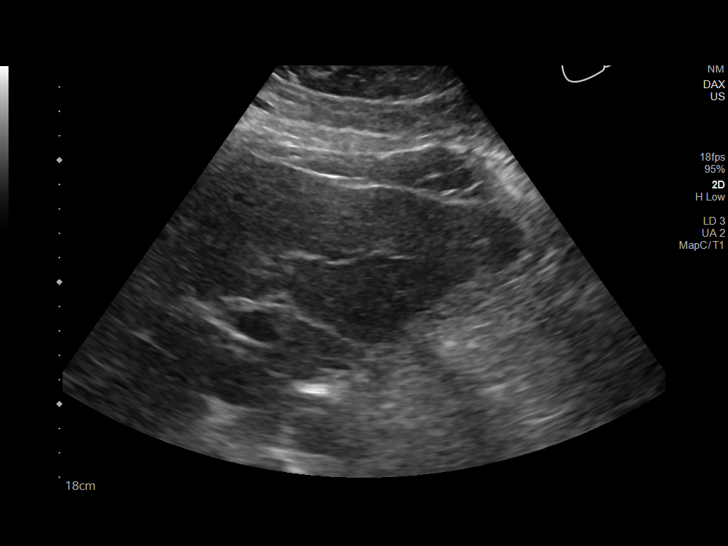
[im 57/57]
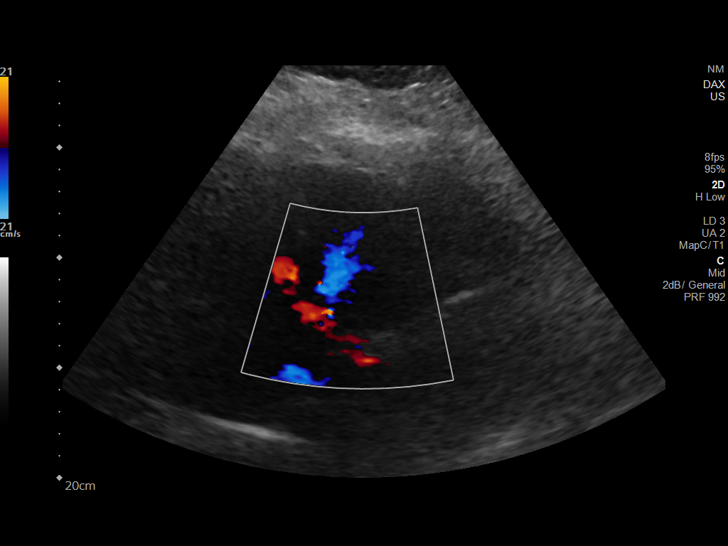

[14 of 25 positions shown; findings below may reference images not displayed]

FINDINGS: Gallbladder:

Numerous gallstones (wall echo shadow sign). No gallbladder wall
thickening or pericholecystic fluid.

Common bile duct:

Diameter: 4 mm

Liver:

At the upper limits of normal for parenchymal echogenicity. No focal
hepatic lesion is seen. Portal vein is patent on color Doppler
imaging with normal direction of blood flow towards the liver.
IMPRESSION: Cholelithiasis, without associated findings to suggest acute
cholecystitis.

## 2020-03-19 ENCOUNTER — Ambulatory Visit: Payer: Medicaid Other | Admitting: Advanced Practice Midwife

## 2020-04-03 ENCOUNTER — Ambulatory Visit: Payer: Medicaid Other | Admitting: Obstetrics & Gynecology

## 2020-04-07 ENCOUNTER — Encounter (HOSPITAL_COMMUNITY): Payer: Self-pay | Admitting: Emergency Medicine

## 2020-04-07 ENCOUNTER — Other Ambulatory Visit: Payer: Self-pay

## 2020-04-07 ENCOUNTER — Emergency Department (HOSPITAL_COMMUNITY)
Admission: EM | Admit: 2020-04-07 | Discharge: 2020-04-07 | Disposition: A | Discharge status: Home / Self Care | Payer: Medicaid Other | Attending: Emergency Medicine | Admitting: Emergency Medicine

## 2020-04-07 DIAGNOSIS — Z5321 Procedure and treatment not carried out due to patient leaving prior to being seen by health care provider: Secondary | ICD-10-CM | POA: Diagnosis not present

## 2020-04-07 DIAGNOSIS — J45909 Unspecified asthma, uncomplicated: Secondary | ICD-10-CM | POA: Insufficient documentation

## 2020-04-07 MED ORDER — ALBUTEROL SULFATE (2.5 MG/3ML) 0.083% IN NEBU: 5.0000 mg | INHALATION_SOLUTION | Freq: Once | RESPIRATORY_TRACT | Status: DC

## 2020-04-07 NOTE — ED Triage Notes (Signed)
Pt. Stated, My asthma started acting up this morning early around 0300. taking my treatments and I guess taking too many. I could feel my heart racing. Last treatment was around 0700. Did not bring inhalers.

## 2020-04-07 NOTE — ED Notes (Signed)
Pt was called several times for vital recheck. We got no answer pt moved to OTF

## 2020-04-08 ENCOUNTER — Ambulatory Visit (INDEPENDENT_AMBULATORY_CARE_PROVIDER_SITE_OTHER): Payer: Medicaid Other | Admitting: Obstetrics

## 2020-04-08 ENCOUNTER — Other Ambulatory Visit (HOSPITAL_COMMUNITY)
Admission: RE | Admit: 2020-04-08 | Discharge: 2020-04-08 | Disposition: A | Discharge status: Home / Self Care | Payer: Medicaid Other | Source: Ambulatory Visit | Attending: Obstetrics | Admitting: Obstetrics

## 2020-04-08 ENCOUNTER — Encounter: Payer: Self-pay | Admitting: Obstetrics

## 2020-04-08 ENCOUNTER — Other Ambulatory Visit: Payer: Self-pay

## 2020-04-08 VITALS — BP 132/88 | HR 111 | Ht 66.0 in | Wt 364.8 lb

## 2020-04-08 DIAGNOSIS — N898 Other specified noninflammatory disorders of vagina: Secondary | ICD-10-CM | POA: Insufficient documentation

## 2020-04-08 DIAGNOSIS — Z6841 Body Mass Index (BMI) 40.0 and over, adult: Secondary | ICD-10-CM

## 2020-04-08 DIAGNOSIS — Z3040 Encounter for surveillance of contraceptives, unspecified: Secondary | ICD-10-CM

## 2020-04-08 DIAGNOSIS — Z01419 Encounter for gynecological examination (general) (routine) without abnormal findings: Secondary | ICD-10-CM | POA: Insufficient documentation

## 2020-04-08 DIAGNOSIS — Z113 Encounter for screening for infections with a predominantly sexual mode of transmission: Secondary | ICD-10-CM | POA: Diagnosis not present

## 2020-04-08 NOTE — Progress Notes (Signed)
Pt is here for Annual /  Pap Smear  Contraception:  Nexplanon    Subjective:        Tina Grant is a 29 y.o. female here for a routine exam.  Current complaints: Vaginal discharge.    Personal health questionnaire:  Is patient Ashkenazi Jewish, have a family history of breast and/or ovarian cancer: no Is there a family history of uterine cancer diagnosed at age < 15, gastrointestinal cancer, urinary tract cancer, family member who is a Personnel officer syndrome-associated carrier: no Is the patient overweight and hypertensive, family history of diabetes, personal history of gestational diabetes, preeclampsia or PCOS: no Is patient over 80, have PCOS,  family history of premature CHD under age 79, diabetes, smoke, have hypertension or peripheral artery disease:  no At any time, has a partner hit, kicked or otherwise hurt or frightened you?: no Over the past 2 weeks, have you felt down, depressed or hopeless?: no Over the past 2 weeks, have you felt little interest or pleasure in doing things?:no   Gynecologic History Patient's last menstrual period was 03/19/2020 (approximate). Contraception: Nexplanon Last Pap: 2018. Results were: normal Last mammogram: n/a. Results were: n/a  Obstetric History OB History  Gravida Para Term Preterm AB Living  3 3 1     3   SAB TAB Ectopic Multiple Live Births        0 3    # Outcome Date GA Lbr Len/2nd Weight Sex Delivery Anes PTL Lv  3 Term 02/13/17 [redacted]w[redacted]d  10 lb 8.1 oz (4.765 kg) F CS-LTranv Spinal  LIV  2 Para 2015     CS-LTranv     1 Para 2010     CS-LTranv       Obstetric Comments  NRFHR for 1st C-section  2nd C-section for repeat.    Past Medical History:  Diagnosis Date  . Anemia    history of  . Asthma   . Complication of anesthesia   . Dyspnea   . Gestational diabetes    diet controlled  . Infection    uti  . MRSA infection     Past Surgical History:  Procedure Laterality Date  . CESAREAN SECTION    . CESAREAN SECTION N/A  02/11/2014  . CESAREAN SECTION N/A 02/13/2017   Procedure: REPEAT CESAREAN SECTION;  Surgeon: 04/15/2017, MD;  Location: [facility] BIRTHING SUITES;  Service: Obstetrics;  Laterality: N/A;  . CHOLECYSTECTOMY  01/31/2019  . CHOLECYSTECTOMY N/A 01/31/2019   Procedure: Laparoscopic Cholecystectomy;  Surgeon: 02/02/2019, MD;  Location: [facility] OR;  Service: General;  Laterality: N/A;     Current Outpatient Medications:  .  albuterol (PROVENTIL) (2.5 MG/3ML) 0.083% nebulizer solution, Take 3 mLs (2.5 mg total) by nebulization every 6 (six) hours as needed for wheezing or shortness of breath., Disp: 75 mL, Rfl: 0 .  budesonide-formoterol (SYMBICORT) 80-4.5 MCG/ACT inhaler, Inhale 2 puffs into the lungs 2 (two) times daily., Disp: , Rfl:  .  etonogestrel (NEXPLANON) 68 MG IMPL implant, 68 mg by Subdermal route once., Disp: , Rfl:  .  ipratropium-albuterol (DUONEB) 0.5-2.5 (3) MG/3ML SOLN, SMARTSIG:3 Milliliter(s) Via Nebulizer Every 6 Hours, Disp: , Rfl:  .  metFORMIN (GLUCOPHAGE) 500 MG tablet, Take 500 mg by mouth 2 (two) times daily with a meal., Disp: , Rfl:  .  phentermine (ADIPEX-P) 37.5 MG tablet, Half to one full tablet every morning, Disp: , Rfl:  .  PROAIR HFA 108 (90 Base) MCG/ACT inhaler, Inhale 2 puffs into the  lungs every 4 (four) hours as needed for wheezing or shortness of breath., Disp: 18 g, Rfl: PRN .  topiramate (TOPAMAX) 25 MG tablet, Take 25 mg by mouth daily., Disp: , Rfl:  .  clotrimazole (GYNE-LOTRIMIN) 1 % vaginal cream, Place 1 Applicatorful vaginally at bedtime. Use once finished with Flagyl treatment (Patient not taking: Reported on 04/08/2020), Disp: 45 g, Rfl: 0 .  FLOVENT HFA 110 MCG/ACT inhaler, Inhale 1 puff into the lungs 2 (two) times a day. (Patient not taking: Reported on 04/08/2020), Disp: , Rfl:  .  metroNIDAZOLE (FLAGYL) 500 MG tablet, Take 1 tablet (500 mg total) by mouth 2 (two) times daily. (Patient not taking: Reported on 04/08/2020), Disp: 14 tablet, Rfl: 0 .   montelukast (SINGULAIR) 10 MG tablet, Take 10 mg by mouth at bedtime. (Patient not taking: Reported on 04/08/2020), Disp: , Rfl:  Allergies  Allergen Reactions  . Shellfish Allergy Anaphylaxis and Hives  . Iodine     Social History   Tobacco Use  . Smoking status: Former Smoker    Types: Cigarettes  . Smokeless tobacco: Never Used  . Tobacco comment: prior to first preg  Substance Use Topics  . Alcohol use: Yes    Comment: occasional    Family History  Problem Relation Age of Onset  . HIV Mother   . Cancer Maternal Grandmother        breast  . Heart disease Maternal Grandfather       Review of Systems  Constitutional: negative for fatigue and weight loss Respiratory: negative for cough and wheezing Cardiovascular: negative for chest pain, fatigue and palpitations Gastrointestinal: negative for abdominal pain and change in bowel habits Musculoskeletal:negative for myalgias Neurological: negative for gait problems and tremors Behavioral/Psych: negative for abusive relationship, depression Endocrine: negative for temperature intolerance    Genitourinary:negative for abnormal menstrual periods, genital lesions, hot flashes, sexual problems. positive for vaginal discharge Integument/breast: negative for breast lump, breast tenderness, nipple discharge and skin lesion(s)    Objective:       BP 132/88   Pulse (!) 111   Ht 5\' 6"  (1.676 m)   Wt (!) 364 lb 12.8 oz (165.5 kg)   LMP 03/19/2020 (Approximate)   BMI 58.88 kg/m  General:   alert and no distress  Skin:   no rash or abnormalities  Lungs:   clear to auscultation bilaterally  Heart:   regular rate and rhythm, S1, S2 normal, no murmur, click, rub or gallop  Breasts:   normal without suspicious masses, skin or nipple changes or axillary nodes  Abdomen:  normal findings: no organomegaly, soft, non-tender and no hernia  Pelvis:  External genitalia: normal general appearance Urinary system: urethral meatus normal and  bladder without fullness, nontender Vaginal: normal without tenderness, induration or masses Cervix: normal appearance Adnexa: normal bimanual exam Uterus: anteverted and non-tender, normal size   Lab Review Urine pregnancy test Labs reviewed yes Radiologic studies reviewed no  50% of 20 min visit spent on counseling and coordination of care.   Assessment:     1. Encounter for gynecological examination with Papanicolaou smear of cervix Rx: - Cytology - PAP( [facility])  2. Vaginal discharge Rx: - Cervicovaginal ancillary only  3. Encounter for surveillance of contraceptive device - pleased with Nexplanon  4. Screen for STD (sexually transmitted disease) Rx: - Hepatitis B surface antigen - Hepatitis C antibody - HIV Antibody (routine testing w rflx) - RPR  5. Class 3 severe obesity due to excess calories without  serious comorbidity with body mass index (BMI) of 50.0 to 59.9 in adult [facility]) - program of caloric reduction, exercise and behavioral modification recommended   Plan:    Education reviewed: calcium supplements, depression evaluation, low fat, low cholesterol diet, safe sex/STD prevention, self breast exams and weight bearing exercise. Contraception: Nexplanon. Follow up in: 2 weeks.    Orders Placed This Encounter  Procedures  . Hepatitis B surface antigen  . Hepatitis C antibody  . HIV Antibody (routine testing w rflx)  . RPR     Brock Bad, MD 04/08/2020 11:00 AM    Nexplanon placed 03/21/2017.   Pt has not had pap since 2018 and is due. Will do annual exam and reschedule nexplanon replacement.

## 2020-04-09 ENCOUNTER — Other Ambulatory Visit: Payer: Self-pay | Admitting: Obstetrics

## 2020-04-09 DIAGNOSIS — B9689 Other specified bacterial agents as the cause of diseases classified elsewhere: Secondary | ICD-10-CM

## 2020-04-09 LAB — CERVICOVAGINAL ANCILLARY ONLY
Bacterial Vaginitis (gardnerella): POSITIVE — AB
Candida Glabrata: NEGATIVE
Candida Vaginitis: NEGATIVE
Chlamydia: NEGATIVE
Comment: NEGATIVE
Comment: NEGATIVE
Comment: NEGATIVE
Comment: NEGATIVE
Comment: NEGATIVE
Comment: NORMAL
Neisseria Gonorrhea: NEGATIVE
Trichomonas: NEGATIVE

## 2020-04-09 LAB — RPR: RPR Ser Ql: NONREACTIVE

## 2020-04-09 LAB — HEPATITIS C ANTIBODY: Hep C Virus Ab: 0.1 s/co ratio (ref 0.0–0.9)

## 2020-04-09 LAB — HEPATITIS B SURFACE ANTIGEN: Hepatitis B Surface Ag: NEGATIVE

## 2020-04-09 LAB — HIV ANTIBODY (ROUTINE TESTING W REFLEX): HIV Screen 4th Generation wRfx: NONREACTIVE

## 2020-04-09 MED ORDER — METRONIDAZOLE 500 MG PO TABS: 500.0000 mg | ORAL_TABLET | Freq: Two times a day (BID) | ORAL | 2 refills | Status: DC

## 2020-04-10 LAB — CYTOLOGY - PAP
Comment: NEGATIVE
Diagnosis: NEGATIVE
High risk HPV: NEGATIVE

## 2020-04-22 ENCOUNTER — Other Ambulatory Visit: Payer: Self-pay

## 2020-04-22 ENCOUNTER — Ambulatory Visit (INDEPENDENT_AMBULATORY_CARE_PROVIDER_SITE_OTHER): Payer: Medicaid Other | Admitting: Obstetrics

## 2020-04-22 ENCOUNTER — Encounter: Payer: Self-pay | Admitting: Obstetrics

## 2020-04-22 VITALS — BP 129/87 | HR 100 | Ht 66.0 in | Wt 366.8 lb

## 2020-04-22 DIAGNOSIS — Z3046 Encounter for surveillance of implantable subdermal contraceptive: Secondary | ICD-10-CM | POA: Diagnosis not present

## 2020-04-22 DIAGNOSIS — Z30017 Encounter for initial prescription of implantable subdermal contraceptive: Secondary | ICD-10-CM | POA: Diagnosis not present

## 2020-04-22 DIAGNOSIS — Z30433 Encounter for removal and reinsertion of intrauterine contraceptive device: Secondary | ICD-10-CM

## 2020-04-22 MED ORDER — ETONOGESTREL 68 MG ~~LOC~~ IMPL
68.0000 mg | DRUG_IMPLANT | Freq: Once | SUBCUTANEOUS | Status: AC
  Administered 2020-04-22: 68 mg via SUBCUTANEOUS

## 2020-04-22 NOTE — Addendum Note (Signed)
Addended by: Marya Landry D on: 04/22/2020 04:48 PM   Modules accepted: Orders

## 2020-04-22 NOTE — Progress Notes (Signed)
Nexplanon Procedure Note    PROCEDURE: Nexplanon Removal and Reinsertion Performing Provider: Brock Bad MD  Patient education prior to procedure, explained risk, benefits of Nexplanon, reviewed alternative options. Patient reported understanding. Gave consent to continue with procedure.  Patient had Nexplanon inserted in 2018. Desires removal today w/ reinsertion  PROCEDURE:  Pregnancy Text :  not indicated Site (check):      RIGHT arm         Sterile Preparation:   Betadinex3 Lot # [id number] Expiration Date 2023  SEP  26    The patient's RIGHT arm was palpated and the implant device located. The area was prepped with Betadinex3. The distal end of the device was palpated and 1.5 cc of 1% lidocaine without epinephrine was injected. A 1.5 mm incision was made. Any fibrotic tissue was carefully dissected away using blunt and/or sharp dissection. The device was removed in an intact manner.   Insertion site was the same as the removal site. Nexplanon  was inserted subcutaneously.Needle was removed from the insertion site. Nexplanon capsule was palpated by provider and patient to assure satisfactory placement. Dressing applied.  Followup: The patient tolerated the procedure well without complications.  Standard post-procedure care is explained and return precautions are given.  Brock Bad MD 04/22/20

## 2020-04-22 NOTE — Progress Notes (Signed)
Patient presents for Nexplanon removal and reinsertion.   Nexplanon placed 03/21/2017  Last pap 04/08/20 Normal

## 2020-05-06 ENCOUNTER — Ambulatory Visit: Payer: Medicaid Other | Admitting: Obstetrics

## 2020-05-18 ENCOUNTER — Emergency Department (HOSPITAL_COMMUNITY): Payer: Medicaid Other

## 2020-05-18 ENCOUNTER — Other Ambulatory Visit: Payer: Self-pay

## 2020-05-18 ENCOUNTER — Encounter (HOSPITAL_COMMUNITY): Payer: Self-pay

## 2020-05-18 ENCOUNTER — Emergency Department (HOSPITAL_COMMUNITY)
Admission: EM | Admit: 2020-05-18 | Discharge: 2020-05-18 | Disposition: A | Discharge status: Home / Self Care | Payer: Medicaid Other | Attending: Emergency Medicine | Admitting: Emergency Medicine

## 2020-05-18 DIAGNOSIS — Z87891 Personal history of nicotine dependence: Secondary | ICD-10-CM | POA: Diagnosis not present

## 2020-05-18 DIAGNOSIS — Z8616 Personal history of COVID-19: Secondary | ICD-10-CM | POA: Insufficient documentation

## 2020-05-18 DIAGNOSIS — Z20822 Contact with and (suspected) exposure to covid-19: Secondary | ICD-10-CM | POA: Insufficient documentation

## 2020-05-18 DIAGNOSIS — Z7951 Long term (current) use of inhaled steroids: Secondary | ICD-10-CM | POA: Insufficient documentation

## 2020-05-18 DIAGNOSIS — R0602 Shortness of breath: Secondary | ICD-10-CM | POA: Diagnosis present

## 2020-05-18 DIAGNOSIS — J45901 Unspecified asthma with (acute) exacerbation: Secondary | ICD-10-CM | POA: Insufficient documentation

## 2020-05-18 LAB — CBC WITH DIFFERENTIAL/PLATELET
Abs Immature Granulocytes: 0.03 10*3/uL (ref 0.00–0.07)
Basophils Absolute: 0 10*3/uL (ref 0.0–0.1)
Basophils Relative: 0 %
Eosinophils Absolute: 0.1 10*3/uL (ref 0.0–0.5)
Eosinophils Relative: 1 %
HCT: 37.7 % (ref 36.0–46.0)
Hemoglobin: 11.8 g/dL — ABNORMAL LOW (ref 12.0–15.0)
Immature Granulocytes: 0 %
Lymphocytes Relative: 41 %
Lymphs Abs: 3.2 10*3/uL (ref 0.7–4.0)
MCH: 24.1 pg — ABNORMAL LOW (ref 26.0–34.0)
MCHC: 31.3 g/dL (ref 30.0–36.0)
MCV: 77.1 fL — ABNORMAL LOW (ref 80.0–100.0)
Monocytes Absolute: 0.6 10*3/uL (ref 0.1–1.0)
Monocytes Relative: 7 %
Neutro Abs: 3.8 10*3/uL (ref 1.7–7.7)
Neutrophils Relative %: 51 %
Platelets: 302 10*3/uL (ref 150–400)
RBC: 4.89 MIL/uL (ref 3.87–5.11)
RDW: 17 % — ABNORMAL HIGH (ref 11.5–15.5)
WBC: 7.7 10*3/uL (ref 4.0–10.5)
nRBC: 0 % (ref 0.0–0.2)

## 2020-05-18 LAB — BASIC METABOLIC PANEL
Anion gap: 9 (ref 5–15)
BUN: 11 mg/dL (ref 6–20)
CO2: 24 mmol/L (ref 22–32)
Calcium: 8.7 mg/dL — ABNORMAL LOW (ref 8.9–10.3)
Chloride: 106 mmol/L (ref 98–111)
Creatinine, Ser: 0.74 mg/dL (ref 0.44–1.00)
GFR, Estimated: >60 mL/min (ref >60)
Glucose, Bld: 110 mg/dL — ABNORMAL HIGH (ref 70–99)
Potassium: 3.9 mmol/L (ref 3.5–5.1)
Sodium: 139 mmol/L (ref 135–145)

## 2020-05-18 LAB — RESPIRATORY PANEL BY RT PCR (FLU A&B, COVID)
Influenza A by PCR: NEGATIVE
Influenza B by PCR: NEGATIVE
SARS Coronavirus 2 by RT PCR: NEGATIVE

## 2020-05-18 MED ORDER — MAGNESIUM SULFATE 2 GM/50ML IV SOLN
2.0000 g | Freq: Once | INTRAVENOUS | Status: AC
  Administered 2020-05-18: 2 g via INTRAVENOUS
  Filled 2020-05-18: qty 50

## 2020-05-18 MED ORDER — EPINEPHRINE 0.3 MG/0.3ML IJ SOAJ: 0.3000 mg | INTRAMUSCULAR | 0 refills | Status: AC | PRN

## 2020-05-18 MED ORDER — ALBUTEROL (5 MG/ML) CONTINUOUS INHALATION SOLN
7.5000 mg/h | INHALATION_SOLUTION | Freq: Once | RESPIRATORY_TRACT | Status: AC
  Administered 2020-05-18: 7.5 mg/h via RESPIRATORY_TRACT
  Filled 2020-05-18: qty 20

## 2020-05-18 NOTE — Progress Notes (Signed)
CAT started at 1701 for acute asthma exacerbation. Minimal BS in LL, URL expiratory wheezing, LRL diminished. Hx of bipap in the past, MD aware. COVID test requested. RT will continue to monitor.

## 2020-05-18 NOTE — ED Provider Notes (Signed)
[facility] EMERGENCY DEPARTMENT Provider Note   CSN: [id number] Arrival date & time: 05/18/20  1644     History Chief Complaint  Patient presents with  . Asthma    Tina Grant is a 29 y.o. female.  The history is provided by the patient and the EMS personnel.  Shortness of Breath Onset quality:  Sudden Duration:  30 minutes Timing:  Constant Chronicity:  New Relieved by:  Nothing Ineffective treatments:  Sitting up and inhaler Associated symptoms: no abdominal pain, no chest pain, no cough, no fever, no neck pain, no rash, no sore throat and no vomiting   Risk factors: obesity   Risk factors comment:  Asthma      Past Medical History:  Diagnosis Date  . Anemia    history of  . Asthma   . Complication of anesthesia   . Dyspnea   . Gestational diabetes    diet controlled  . Infection    uti  . MRSA infection     Patient Active Problem List   Diagnosis Date Noted  . COVID-19 virus detected 07/30/2019  . Gallstones 01/31/2019  . Nexplanon insertion 03/21/2017  . S/P cesarean section 02/13/2017  . Gestational diabetes mellitus (GDM) in third trimester 12/02/2016  . Maternal iron deficiency anemia affecting pregnancy, antepartum, third trimester 12/02/2016  . History of asthma 12/01/2016  . GBS bacteriuria 08/05/2016  . Supervision of high risk pregnancy, antepartum 07/27/2016  . Morbid obesity (HCC) 07/27/2016  . H/O: C-section 07/27/2016    Past Surgical History:  Procedure Laterality Date  . CESAREAN SECTION    . CESAREAN SECTION N/A 02/11/2014  . CESAREAN SECTION N/A 02/13/2017   Procedure: REPEAT CESAREAN SECTION;  Surgeon: Lazaro Arms, MD;  Location: [facility] BIRTHING SUITES;  Service: Obstetrics;  Laterality: N/A;  . CHOLECYSTECTOMY  01/31/2019  . CHOLECYSTECTOMY N/A 01/31/2019   Procedure: Laparoscopic Cholecystectomy;  Surgeon: Griselda Miner, MD;  [facility]: [facility] OR;  Service: General;  Laterality: N/A;     OB History    Gravida   3   Para  3   Term  1   Preterm      AB      Living  3     SAB      TAB      Ectopic      Multiple  0   Live Births  3        Obstetric Comments  NRFHR for 1st C-section 2nd C-section for repeat.        Family History  Problem Relation Age of Onset  . HIV Mother   . Cancer Maternal Grandmother        breast  . Heart disease Maternal Grandfather     Social History   Tobacco Use  . Smoking status: Former Smoker    Types: Cigarettes  . Smokeless tobacco: Never Used  . Tobacco comment: prior to first preg  Vaping Use  . Vaping Use: Never used  Substance Use Topics  . Alcohol use: Yes    Comment: occasional  . Drug use: No    Home Medications Prior to Admission medications   Medication Sig Start Date End Date Taking? Authorizing Provider  albuterol (PROVENTIL) (2.5 MG/3ML) 0.083% nebulizer solution Take 3 mLs (2.5 mg total) by nebulization every 6 (six) hours as needed for wheezing or shortness of breath. 11/20/19   Blane Ohara, MD  budesonide-formoterol (SYMBICORT) 80-4.5 MCG/ACT inhaler Inhale 2 puffs into the lungs 2 (  two) times daily.    [provider]  EPINEPHrine 0.3 mg/0.3 mL IJ SOAJ injection Inject 0.3 mg into the muscle as needed for anaphylaxis. 05/18/20   Brantley Fling, MD  etonogestrel (NEXPLANON) 68 MG IMPL implant 68 mg by Subdermal route once.    [provider]  FLOVENT HFA 110 MCG/ACT inhaler Inhale 1 puff into the lungs 2 (two) times a day.  11/29/18   [provider]  ipratropium-albuterol (DUONEB) 0.5-2.5 (3) MG/3ML SOLN SMARTSIG:3 Milliliter(s) Via Nebulizer Every 6 Hours 10/13/19   [provider]  metFORMIN (GLUCOPHAGE) 500 MG tablet Take 500 mg by mouth 2 (two) times daily with a meal.    [provider]  montelukast (SINGULAIR) 10 MG tablet Take 10 mg by mouth at bedtime.     [provider]  phentermine (ADIPEX-P) 37.5 MG tablet Half to one full tablet every morning 01/01/20    [provider]  PROAIR HFA 108 (90 Base) MCG/ACT inhaler Inhale 2 puffs into the lungs every 4 (four) hours as needed for wheezing or shortness of breath. 11/20/19   Blane Ohara, MD  topiramate (TOPAMAX) 25 MG tablet Take 25 mg by mouth daily. 01/01/20   [provider]    Allergies    Shellfish allergy and Iodine  Review of Systems   Review of Systems  Constitutional: Negative for chills and fever.  HENT: Negative for facial swelling, sore throat and trouble swallowing.   Respiratory: Positive for shortness of breath. Negative for cough.   Cardiovascular: Negative for chest pain.  Gastrointestinal: Negative for abdominal pain, diarrhea, nausea and vomiting.  Musculoskeletal: Negative for neck pain.  Skin: Negative for rash.  Neurological: Negative for dizziness.  All other systems reviewed and are negative.   Physical Exam Updated Vital Signs BP 117/82 (BP Location: Right Arm)   Pulse (!) 120   Temp 98.5 F (36.9 C)   Resp 16   Ht 5\' 6"  (1.676 m)   Wt (!) 166 kg   SpO2 97%   BMI 59.07 kg/m   Physical Exam Vitals reviewed.  Constitutional:      General: She is in acute distress.     Appearance: She is obese.  HENT:     Head: Normocephalic and atraumatic.     Nose: Nose normal.  Eyes:     Conjunctiva/sclera: Conjunctivae normal.  Pulmonary:     Effort: Respiratory distress present.     Breath sounds: Wheezing present.  Abdominal:     General: Abdomen is flat.     Palpations: Abdomen is soft.     Tenderness: There is no abdominal tenderness.  Musculoskeletal:     Cervical back: Neck supple. No tenderness.     Right lower leg: No edema.     Left lower leg: No edema.  Skin:    General: Skin is warm and dry.     Findings: No rash.     ED Results / Procedures / Treatments   Labs (all labs ordered are listed, but only abnormal results are displayed) Labs Reviewed  BASIC METABOLIC PANEL - Abnormal; Notable for the following components:       Result Value   Glucose, Bld 110 (*)    Calcium 8.7 (*)    All other components within normal limits  CBC WITH DIFFERENTIAL/PLATELET - Abnormal; Notable for the following components:   Hemoglobin 11.8 (*)    MCV 77.1 (*)    MCH 24.1 (*)    RDW 17.0 (*)  All other components within normal limits  RESPIRATORY PANEL BY RT PCR (FLU A&B, COVID)    EKG EKG Interpretation  Date/Time:  Sunday May 18 2020 16:52:21 EST Ventricular Rate:  135 PR Interval:    QRS Duration: 102 QT Interval:  301 QTC Calculation: 452 R Axis:   133 Text Interpretation: Sinus tachycardia Baseline wander Confirmed by Cathren Laine ([id number]) on 05/18/2020 5:39:29 PM   Radiology DG Chest Port 1 View  Result Date: 05/18/2020 CLINICAL DATA:  Short of breath, asthma EXAM: PORTABLE CHEST 1 VIEW COMPARISON:  12/02/2018 FINDINGS: Single frontal view of the chest demonstrates an unremarkable cardiac silhouette. No acute airspace disease, effusion, or pneumothorax. Evaluation limited by portable technique and body habitus. IMPRESSION: 1. No acute intrathoracic process. Electronically Signed   By: Sharlet Salina M.D.   On: 05/18/2020 17:26    Procedures Procedures (including critical care time)  Medications Ordered in ED Medications  magnesium sulfate IVPB 2 g 50 mL (0 g Intravenous Stopped 05/18/20 1856)  albuterol (PROVENTIL,VENTOLIN) solution continuous neb (7.5 mg/hr Nebulization Given 05/18/20 1701)    ED Course  I have reviewed the triage vital signs and the nursing notes.  Pertinent labs & imaging results that were available during my care of the patient were reviewed by me and considered in my medical decision making (see chart for details).    MDM Rules/Calculators/A&P                           Medical Decision Making: Tina Grant is a 29 y.o. female who presented to the ED today with SOB. Pt reports sudden onset difficulty breathing, pt has history of asthma, tried using inhaler without  relief, called EMS, sating 88% with increased WOB,  given Epi, solumedrol, duoneb x3 prior to arrival with improvement in WOB and saturating 96% on 5L nonrebreather.   Past medical history significant for asthma uses albuterol and symbicort at hom. Pt reports history of hospitalization for asthma, has not been intubated before, has required bipap in the past. Reviewed and confirmed nursing documentation for past medical history, family history, social history.  On my initial exam, the pt was increased WOB, decreased breath sounds with inspiratory and expiratory wheezing, no rash, soft abdomen, no facial swelling.   No evidence of anaphylaxis at this time, clinical picture more consistent with asthma exacerbation.  Pt started on continuous albuterol. Given Mag.  Labs and imaging unremarkable.   On reassessment, pt off continuous on RA, normal WOB, improved breath sounds, with mild wheezing bilaterally.   Pt stable for dc home at this time, advised to continue to use albuterol inhaler at home.   Pt given rx refill for epipen as she reports she does not have one at this time and has known anaphylaxis to shellfish.  Advised to follow up with PCP regarding adjustment of asthma regimen.   Consults: none performed All radiology and laboratory studies reviewed independently and with my attending physician, agree with reading provided by radiologist unless otherwise noted.   Based on the above findings, I believe patient is hemodynamically stable for discharge  Patient/and family educated about specific return precautions for given chief complaint and symptoms.  Patient/and family educated about follow-up with PCP.  Patient/and family expressed understanding of return precautions and need for follow-up.  Patient discharged.  The above care was discussed with and agreed upon by my attending physician. Emergency Department Medication Summary:  Medications  magnesium sulfate IVPB 2  g 50 mL (0 g  Intravenous Stopped 05/18/20 1856)  albuterol (PROVENTIL,VENTOLIN) solution continuous neb (7.5 mg/hr Nebulization Given 05/18/20 1701)       Final Clinical Impression(s) / ED Diagnoses Final diagnoses:  Severe asthma with exacerbation, unspecified whether persistent    Rx / DC Orders ED Discharge Orders         Ordered    EPINEPHrine 0.3 mg/0.3 mL IJ SOAJ injection  As needed        05/18/20 1937           Brantley FlingLyons, Nivin Braniff, MD 05/18/20 Freddi [id number]    Steinl, Kevin, MD 05/18/20 2326

## 2020-05-18 NOTE — ED Triage Notes (Signed)
Pt BIB [facility] from home with Asthma Exacerbation.  Pt not responding to home medications after pt began having SOB and wheezing.  EMS VS: Mid 80s RA Other VSS   EMS Gave: 3 Duo-Nebs  0.3 EPI 125 Solu-Medrol

## 2020-05-18 NOTE — ED Notes (Signed)
Patient verbalizes understanding of discharge instructions. Opportunity for questioning and answers were provided. Armband removed by staff, pt discharged from ED in wheelchair to lobby to await transportation arranged by patient.

## 2020-07-10 ENCOUNTER — Ambulatory Visit: Payer: Medicaid Other | Admitting: Obstetrics

## 2020-11-06 ENCOUNTER — Other Ambulatory Visit: Payer: Self-pay

## 2020-11-06 ENCOUNTER — Emergency Department (HOSPITAL_COMMUNITY)
Admission: EM | Admit: 2020-11-06 | Discharge: 2020-11-07 | Disposition: A | Discharge status: Home / Self Care | Payer: Medicaid Other | Attending: Emergency Medicine | Admitting: Emergency Medicine

## 2020-11-06 ENCOUNTER — Encounter (HOSPITAL_COMMUNITY): Payer: Self-pay

## 2020-11-06 DIAGNOSIS — Z7951 Long term (current) use of inhaled steroids: Secondary | ICD-10-CM | POA: Diagnosis not present

## 2020-11-06 DIAGNOSIS — Z8616 Personal history of COVID-19: Secondary | ICD-10-CM | POA: Insufficient documentation

## 2020-11-06 DIAGNOSIS — Z87891 Personal history of nicotine dependence: Secondary | ICD-10-CM | POA: Insufficient documentation

## 2020-11-06 DIAGNOSIS — J45909 Unspecified asthma, uncomplicated: Secondary | ICD-10-CM | POA: Diagnosis not present

## 2020-11-06 DIAGNOSIS — L03116 Cellulitis of left lower limb: Secondary | ICD-10-CM | POA: Insufficient documentation

## 2020-11-06 DIAGNOSIS — R2242 Localized swelling, mass and lump, left lower limb: Secondary | ICD-10-CM | POA: Diagnosis present

## 2020-11-06 LAB — CBC WITH DIFFERENTIAL/PLATELET
Abs Immature Granulocytes: 0.03 10*3/uL (ref 0.00–0.07)
Basophils Absolute: 0 10*3/uL (ref 0.0–0.1)
Basophils Relative: 1 %
Eosinophils Absolute: 0.1 10*3/uL (ref 0.0–0.5)
Eosinophils Relative: 2 %
HCT: 35.6 % — ABNORMAL LOW (ref 36.0–46.0)
Hemoglobin: 11.2 g/dL — ABNORMAL LOW (ref 12.0–15.0)
Immature Granulocytes: 0 %
Lymphocytes Relative: 32 %
Lymphs Abs: 2.6 10*3/uL (ref 0.7–4.0)
MCH: 22.8 pg — ABNORMAL LOW (ref 26.0–34.0)
MCHC: 31.5 g/dL (ref 30.0–36.0)
MCV: 72.4 fL — ABNORMAL LOW (ref 80.0–100.0)
Monocytes Absolute: 0.4 10*3/uL (ref 0.1–1.0)
Monocytes Relative: 5 %
Neutro Abs: 4.8 10*3/uL (ref 1.7–7.7)
Neutrophils Relative %: 60 %
Platelets: 346 10*3/uL (ref 150–400)
RBC: 4.92 MIL/uL (ref 3.87–5.11)
RDW: 19.9 % — ABNORMAL HIGH (ref 11.5–15.5)
WBC: 8 10*3/uL (ref 4.0–10.5)
nRBC: 0 % (ref 0.0–0.2)

## 2020-11-06 LAB — COMPREHENSIVE METABOLIC PANEL
ALT: 13 U/L (ref 0–44)
AST: 10 U/L — ABNORMAL LOW (ref 15–41)
Albumin: 3.6 g/dL (ref 3.5–5.0)
Alkaline Phosphatase: 69 U/L (ref 38–126)
Anion gap: 7 (ref 5–15)
BUN: 10 mg/dL (ref 6–20)
CO2: 23 mmol/L (ref 22–32)
Calcium: 9 mg/dL (ref 8.9–10.3)
Chloride: 107 mmol/L (ref 98–111)
Creatinine, Ser: 0.64 mg/dL (ref 0.44–1.00)
GFR, Estimated: >60 mL/min (ref >60)
Glucose, Bld: 133 mg/dL — ABNORMAL HIGH (ref 70–99)
Potassium: 3.6 mmol/L (ref 3.5–5.1)
Sodium: 137 mmol/L (ref 135–145)
Total Bilirubin: 0.2 mg/dL — ABNORMAL LOW (ref 0.3–1.2)
Total Protein: 7 g/dL (ref 6.5–8.1)

## 2020-11-06 NOTE — ED Triage Notes (Signed)
Left foot/ left leg swelling with redness noticed this morning. Denies any fever and chills, recent trauma.

## 2020-11-06 NOTE — ED Provider Notes (Signed)
Emergency Medicine Provider Triage Evaluation Note  Tina Grant , a 30 y.o. female  was evaluated in triage.  Pt complains of swelling and pain left foot and ankle.  Pt complains of redness  Review of Systems  Positive:  Negative:   Physical Exam  BP 134/84 (BP Location: Right Arm)   Pulse (!) 122   Temp 99.5 F (37.5 C)   Resp 19   SpO2 98%  Gen:   Awake, no distress   Resp:  Normal effort  MSK:   Moves extremities without difficulty  Other:    Medical Decision Making  Medically screening exam initiated at 8:45 PM.  Appropriate orders placed.  Koleen Distance was informed that the remainder of the evaluation will be completed by another provider, this initial triage assessment does not replace that evaluation, and the importance of remaining in the ED until their evaluation is complete.     Osie Cheeks 11/06/20 2046    Arby Barrette, MD 11/18/20 [phone]

## 2020-11-07 ENCOUNTER — Ambulatory Visit (HOSPITAL_COMMUNITY): Payer: Medicaid Other | Attending: Emergency Medicine

## 2020-11-07 MED ORDER — DOXYCYCLINE HYCLATE 100 MG PO TABS
100.0000 mg | ORAL_TABLET | Freq: Once | ORAL | Status: AC
  Administered 2020-11-07: 100 mg via ORAL
  Filled 2020-11-07: qty 1

## 2020-11-07 NOTE — ED Notes (Signed)
Patient verbalized understanding of discharge instructions. Opportunity for questions and answers.  

## 2020-11-07 NOTE — ED Notes (Signed)
Pt is very edematous on her left lower foot,ankle and leg. Pt states it started this morning, pt denies any cardiac hx. Pt denies trauma being present

## 2020-11-07 NOTE — ED Provider Notes (Signed)
[facility] EMERGENCY DEPARTMENT Provider Note   CSN: [id number] Arrival date & time: 11/06/20  2030     History Chief Complaint  Patient presents with  . Leg Swelling    Tina Grant is a 30 y.o. female.  Patient with left leg swelling and erythema.  The redness is been worse over the last day or 2.  No new chest pain or shortness of breath.  No trauma.  No break in the skin that she knows of.  No systemic symptoms        Past Medical History:  Diagnosis Date  . Anemia    history of  . Asthma   . Complication of anesthesia   . Dyspnea   . Gestational diabetes    diet controlled  . Infection    uti  . MRSA infection     Patient Active Problem List   Diagnosis Date Noted  . COVID-19 virus detected 07/30/2019  . Gallstones 01/31/2019  . Nexplanon insertion 03/21/2017  . S/P cesarean section 02/13/2017  . Gestational diabetes mellitus (GDM) in third trimester 12/02/2016  . Maternal iron deficiency anemia affecting pregnancy, antepartum, third trimester 12/02/2016  . History of asthma 12/01/2016  . GBS bacteriuria 08/05/2016  . Supervision of high risk pregnancy, antepartum 07/27/2016  . Morbid obesity (HCC) 07/27/2016  . H/O: C-section 07/27/2016    Past Surgical History:  Procedure Laterality Date  . CESAREAN SECTION    . CESAREAN SECTION N/A 02/11/2014  . CESAREAN SECTION N/A 02/13/2017   Procedure: REPEAT CESAREAN SECTION;  Surgeon: Lazaro Arms, MD;  Location: [facility] BIRTHING SUITES;  Service: Obstetrics;  Laterality: N/A;  . CHOLECYSTECTOMY  01/31/2019  . CHOLECYSTECTOMY N/A 01/31/2019   Procedure: Laparoscopic Cholecystectomy;  Surgeon: Griselda Miner, MD;  Location: [facility] OR;  Service: General;  Laterality: N/A;     OB History    Gravida  3   Para  3   Term  1   Preterm      AB      Living  3     SAB      IAB      Ectopic      Multiple  0   Live Births  3        Obstetric Comments  NRFHR for 1st C-section 2nd  C-section for repeat.        Family History  Problem Relation Age of Onset  . HIV Mother   . Cancer Maternal Grandmother        breast  . Heart disease Maternal Grandfather     Social History   Tobacco Use  . Smoking status: Former Smoker    Types: Cigarettes  . Smokeless tobacco: Never Used  . Tobacco comment: prior to first preg  Vaping Use  . Vaping Use: Never used  Substance Use Topics  . Alcohol use: Yes    Comment: occasional  . Drug use: No    Home Medications Prior to Admission medications   Medication Sig Start Date End Date Taking? Authorizing Provider  albuterol (PROVENTIL) (2.5 MG/3ML) 0.083% nebulizer solution Take 3 mLs (2.5 mg total) by nebulization every 6 (six) hours as needed for wheezing or shortness of breath. 11/20/19   Blane Ohara, MD  budesonide-formoterol (SYMBICORT) 80-4.5 MCG/ACT inhaler Inhale 2 puffs into the lungs 2 (two) times daily.    [provider]  EPINEPHrine 0.3 mg/0.3 mL IJ SOAJ injection Inject 0.3 mg into the muscle as needed for anaphylaxis.  05/18/20   Brantley Fling, MD  etonogestrel (NEXPLANON) 68 MG IMPL implant 68 mg by Subdermal route once.    [provider]  FLOVENT HFA 110 MCG/ACT inhaler Inhale 1 puff into the lungs 2 (two) times a day.  11/29/18   [provider]  ipratropium-albuterol (DUONEB) 0.5-2.5 (3) MG/3ML SOLN SMARTSIG:3 Milliliter(s) Via Nebulizer Every 6 Hours 10/13/19   [provider]  metFORMIN (GLUCOPHAGE) 500 MG tablet Take 500 mg by mouth 2 (two) times daily with a meal.    [provider]  montelukast (SINGULAIR) 10 MG tablet Take 10 mg by mouth at bedtime.     [provider]  phentermine (ADIPEX-P) 37.5 MG tablet Half to one full tablet every morning 01/01/20   [provider]  PROAIR HFA 108 (90 Base) MCG/ACT inhaler Inhale 2 puffs into the lungs every 4 (four) hours as needed for wheezing or shortness of breath. 11/20/19   Blane Ohara, MD   topiramate (TOPAMAX) 25 MG tablet Take 25 mg by mouth daily. 01/01/20   [provider]    Allergies    Shellfish allergy and Iodine  Review of Systems   Review of Systems  All other systems reviewed and are negative.   Physical Exam Updated Vital Signs BP 100/63 (BP Location: Right Arm)   Pulse (!) 104   Temp 98.7 F (37.1 C) (Oral)   Resp 18   Ht 5\' 6"  (1.676 m)   Wt (!) 162.4 kg   SpO2 98%   BMI 57.78 kg/m   Physical Exam Vitals and nursing note reviewed.  Constitutional:      Appearance: She is well-developed.  HENT:     Head: Normocephalic and atraumatic.     Nose: Nose normal. No congestion or rhinorrhea.     Mouth/Throat:     Mouth: Mucous membranes are moist.     Pharynx: Oropharynx is clear.  Eyes:     Pupils: Pupils are equal, round, and reactive to light.  Cardiovascular:     Rate and Rhythm: Normal rate and regular rhythm.  Pulmonary:     Effort: No respiratory distress.     Breath sounds: No stridor.  Abdominal:     General: Abdomen is flat. There is no distension.  Musculoskeletal:        General: No swelling or tenderness. Normal range of motion.     Cervical back: Normal range of motion.  Skin:    General: Skin is warm and dry.     Findings: Erythema (to left anterior ankle and warmth) present.  Neurological:     General: No focal deficit present.     Mental Status: She is alert.     ED Results / Procedures / Treatments   Labs (all labs ordered are listed, but only abnormal results are displayed) Labs Reviewed  CBC WITH DIFFERENTIAL/PLATELET - Abnormal; Notable for the following components:      Result Value   Hemoglobin 11.2 (*)    HCT 35.6 (*)    MCV 72.4 (*)    MCH 22.8 (*)    RDW 19.9 (*)    All other components within normal limits  COMPREHENSIVE METABOLIC PANEL - Abnormal; Notable for the following components:   Glucose, Bld 133 (*)    AST 10 (*)    Total Bilirubin 0.2 (*)    All other components within normal  limits    EKG None  Radiology No results found.  Procedures Procedures   Medications Ordered in ED  Medications  doxycycline (VIBRA-TABS) tablet 100 mg (100 mg Oral Given 11/07/20 8638)    ED Course  I have reviewed the triage vital signs and the nursing notes.  Pertinent labs & imaging results that were available during my care of the patient were reviewed by me and considered in my medical decision making (see chart for details).    MDM Rules/Calculators/A&P                          Low suspicion for PE.  I think the patient has cellulitis.  No evidence of sepsis or any other indication for imaging or further work-up/hospitalization.  Stable for discharge.   Final Clinical Impression(s) / ED Diagnoses Final diagnoses:  Cellulitis of left lower extremity    Rx / DC Orders ED Discharge Orders         Ordered    LE VENOUS        11/07/20 0612           Qamar Aughenbaugh, Barbara Cower, MD 11/08/20 [phone]

## 2021-02-05 ENCOUNTER — Other Ambulatory Visit: Payer: Self-pay

## 2021-02-05 ENCOUNTER — Other Ambulatory Visit (HOSPITAL_COMMUNITY)
Admission: RE | Admit: 2021-02-05 | Discharge: 2021-02-05 | Disposition: A | Discharge status: Home / Self Care | Payer: Medicaid Other | Source: Ambulatory Visit | Attending: Obstetrics | Admitting: Obstetrics

## 2021-02-05 ENCOUNTER — Ambulatory Visit (INDEPENDENT_AMBULATORY_CARE_PROVIDER_SITE_OTHER): Payer: Medicaid Other | Admitting: *Deleted

## 2021-02-05 DIAGNOSIS — Z113 Encounter for screening for infections with a predominantly sexual mode of transmission: Secondary | ICD-10-CM | POA: Diagnosis not present

## 2021-02-05 DIAGNOSIS — Z202 Contact with and (suspected) exposure to infections with a predominantly sexual mode of transmission: Secondary | ICD-10-CM | POA: Diagnosis not present

## 2021-02-05 NOTE — Progress Notes (Signed)
SUBJECTIVE:  30 y.o. female who desires a STI screen. Denies abnormal vaginal discharge, bleeding or significant pelvic pain. No UTI symptoms. Denies history of known exposure to STD.  No LMP recorded. Patient has had an implant.  OBJECTIVE:  She appears well.   ASSESSMENT:  STI Screen   PLAN:  Pt offered STI blood screening- GC, chlamydia, and trichomonas probe sent to lab.  Treatment: To be determined once lab results are received.  Pt follow up as needed.

## 2021-02-06 ENCOUNTER — Other Ambulatory Visit: Payer: Self-pay | Admitting: Obstetrics

## 2021-02-06 DIAGNOSIS — B9689 Other specified bacterial agents as the cause of diseases classified elsewhere: Secondary | ICD-10-CM

## 2021-02-06 LAB — RPR+HBSAG+HCVAB+...
HIV Screen 4th Generation wRfx: NONREACTIVE
Hep C Virus Ab: <0.1 s/co ratio (ref 0.0–0.9)
Hepatitis B Surface Ag: NEGATIVE
RPR Ser Ql: NONREACTIVE

## 2021-02-06 LAB — CERVICOVAGINAL ANCILLARY ONLY
Bacterial Vaginitis (gardnerella): POSITIVE — AB
Chlamydia: NEGATIVE
Comment: NEGATIVE
Comment: NEGATIVE
Comment: NEGATIVE
Comment: NORMAL
Neisseria Gonorrhea: NEGATIVE
Trichomonas: NEGATIVE

## 2021-02-06 MED ORDER — METRONIDAZOLE 500 MG PO TABS: 500.0000 mg | ORAL_TABLET | Freq: Two times a day (BID) | ORAL | 2 refills | Status: DC

## 2021-02-06 NOTE — Progress Notes (Signed)
fla 

## 2022-04-08 ENCOUNTER — Other Ambulatory Visit (HOSPITAL_COMMUNITY)
Admission: RE | Admit: 2022-04-08 | Discharge: 2022-04-08 | Disposition: A | Discharge status: Home / Self Care | Payer: Medicaid Other | Source: Ambulatory Visit

## 2022-04-08 ENCOUNTER — Ambulatory Visit (INDEPENDENT_AMBULATORY_CARE_PROVIDER_SITE_OTHER): Payer: Medicaid Other

## 2022-04-08 VITALS — BP 131/88 | HR 111 | Ht 66.0 in | Wt 388.4 lb

## 2022-04-08 DIAGNOSIS — Z975 Presence of (intrauterine) contraceptive device: Secondary | ICD-10-CM

## 2022-04-08 DIAGNOSIS — R319 Hematuria, unspecified: Secondary | ICD-10-CM

## 2022-04-08 DIAGNOSIS — Z113 Encounter for screening for infections with a predominantly sexual mode of transmission: Secondary | ICD-10-CM | POA: Insufficient documentation

## 2022-04-08 DIAGNOSIS — Z3046 Encounter for surveillance of implantable subdermal contraceptive: Secondary | ICD-10-CM | POA: Diagnosis not present

## 2022-04-08 NOTE — Progress Notes (Signed)
Pt presents to discuss AUB with Nexplanon.  Nexplanon placed in Oct 2021.   Typically has no periods, no spotting. In the beginning of Sept 2023 noticed blood in urine, describes as red wine. Notes malodor. Denies low back pain, abdominal pain, or pelvic pain. Denies dysuria, urinary frequency or urgency. No blood in underwear, no blood when wiping.   Desires STD screen.

## 2022-04-08 NOTE — Progress Notes (Signed)
   GYNECOLOGY PROBLEM OFFICE VISIT NOTE  History:  Tina Grant is a 31 y.o. G3P1003 here today for abnormal uterine bleeding. She states she has a Nexplanon in place since Oct 2021.  She states the beginning of Sept she started noting blood with urination, but nothing with wiping. She did not have to wear a pad and did not experience cramping.  She then states she was having some cramping initially, but they resolved. She denies any abnormal vaginal discharge, pelvic pain or pain or discomfort with sex. She states her symptoms resolved yesterday.   Past Medical History:  Diagnosis Date   Anemia    history of   Asthma    Complication of anesthesia    Dyspnea    Gestational diabetes    diet controlled   Infection    uti   MRSA infection     Past Surgical History:  Procedure Laterality Date   CESAREAN SECTION     CESAREAN SECTION N/A 02/11/2014   CESAREAN SECTION N/A 02/13/2017   Procedure: REPEAT CESAREAN SECTION;  Surgeon: Florian Buff, MD;  Location: [place];  Service: Obstetrics;  Laterality: N/A;   CHOLECYSTECTOMY  01/31/2019   CHOLECYSTECTOMY N/A 01/31/2019   Procedure: Laparoscopic Cholecystectomy;  Surgeon: [name], MD;  Location: [place];  Service: General;  Laterality: N/A;    The following portions of the patient's history were reviewed and updated as appropriate: allergies, current medications, past family history, past medical history, past social history, past surgical history and problem list.   Health Maintenance:  Normal pap and negative HRHPV on Oct 2021.  Normal mammogram on file d/t age.   Review of Systems:  Genito-Urinary ROS: positive for - hematuria Gastrointestinal ROS: negative Objective:  Vitals: BP 131/88   Pulse (!) 111   Ht 5\' 6"  (1.676 m)   Wt (!) 388 lb 6.4 oz (176.2 kg)   BMI 62.69 kg/m   Physical Exam: Physical Exam Constitutional:      Appearance: Normal appearance. She is obese. She is not ill-appearing.  HENT:      Head: Normocephalic and atraumatic.  Eyes:     Conjunctiva/sclera: Conjunctivae normal.  Cardiovascular:     Rate and Rhythm: Normal rate.  Pulmonary:     Effort: Pulmonary effort is normal. No respiratory distress.  Musculoskeletal:        General: Normal range of motion.     Cervical back: Normal range of motion.  Neurological:     Mental Status: She is alert and oriented to person, place, and time.  Skin:    General: Skin is warm.     Comments: Nexplanon palpated in right upper arm.   Psychiatric:        Mood and Affect: Mood normal.        Behavior: Behavior normal.  Vitals reviewed.      Labs and Imaging: No results found.  Assessment & Plan:  31 year old Hematuria Desires STD Testing Nexplanon in place  -Discussed that blood likely coming from urine.  -Plan to perform urine culture and treat any infections.  -If UC normal, plan for renal US.  -Patient reports satisfaction with Nexplanon.  Due for replacement next year.  -Patient with full STD testing. Will treat appropriately.    Total face-to-face time with patient: 30 minutes   Gavin Pound, CNM 04/08/2022 10:32 AM

## 2022-04-09 LAB — CERVICOVAGINAL ANCILLARY ONLY
Bacterial Vaginitis (gardnerella): POSITIVE — AB
Candida Glabrata: NEGATIVE
Candida Vaginitis: NEGATIVE
Chlamydia: NEGATIVE
Comment: NEGATIVE
Comment: NEGATIVE
Comment: NEGATIVE
Comment: NEGATIVE
Comment: NEGATIVE
Comment: NORMAL
Neisseria Gonorrhea: NEGATIVE
Trichomonas: NEGATIVE

## 2022-04-09 LAB — HEPATITIS C ANTIBODY: Hep C Virus Ab: NONREACTIVE

## 2022-04-09 LAB — HEPATITIS B SURFACE ANTIGEN: Hepatitis B Surface Ag: NEGATIVE

## 2022-04-09 LAB — HIV ANTIBODY (ROUTINE TESTING W REFLEX): HIV Screen 4th Generation wRfx: NONREACTIVE

## 2022-04-09 LAB — RPR: RPR Ser Ql: NONREACTIVE

## 2022-04-09 MED ORDER — METRONIDAZOLE 500 MG PO TABS: 500.0000 mg | ORAL_TABLET | Freq: Two times a day (BID) | ORAL | 0 refills | Status: DC

## 2022-04-09 NOTE — Addendum Note (Signed)
Addended by: Gavin Pound L on: 04/09/2022 10:50 PM   Modules accepted: Orders

## 2022-04-11 LAB — URINE CULTURE

## 2022-04-11 MED ORDER — CEFADROXIL 500 MG PO CAPS: 500.0000 mg | ORAL_CAPSULE | Freq: Two times a day (BID) | ORAL | 0 refills | Status: DC

## 2022-04-11 NOTE — Addendum Note (Signed)
Addended by: Gavin Pound L on: 04/11/2022 07:02 AM   Modules accepted: Orders

## 2022-06-21 ENCOUNTER — Emergency Department (HOSPITAL_COMMUNITY)
Admission: EM | Admit: 2022-06-21 | Discharge: 2022-06-21 | Disposition: A | Discharge status: Home / Self Care | Payer: Medicaid Other | Attending: Emergency Medicine | Admitting: Emergency Medicine

## 2022-06-21 ENCOUNTER — Emergency Department (HOSPITAL_COMMUNITY): Payer: Medicaid Other

## 2022-06-21 ENCOUNTER — Other Ambulatory Visit: Payer: Self-pay

## 2022-06-21 ENCOUNTER — Encounter (HOSPITAL_COMMUNITY): Payer: Self-pay

## 2022-06-21 DIAGNOSIS — Z7984 Long term (current) use of oral hypoglycemic drugs: Secondary | ICD-10-CM | POA: Diagnosis not present

## 2022-06-21 DIAGNOSIS — Z7951 Long term (current) use of inhaled steroids: Secondary | ICD-10-CM | POA: Diagnosis not present

## 2022-06-21 DIAGNOSIS — R000 Tachycardia, unspecified: Secondary | ICD-10-CM | POA: Diagnosis not present

## 2022-06-21 DIAGNOSIS — Z20822 Contact with and (suspected) exposure to covid-19: Secondary | ICD-10-CM | POA: Diagnosis not present

## 2022-06-21 DIAGNOSIS — J45909 Unspecified asthma, uncomplicated: Secondary | ICD-10-CM | POA: Insufficient documentation

## 2022-06-21 DIAGNOSIS — R0602 Shortness of breath: Secondary | ICD-10-CM | POA: Diagnosis present

## 2022-06-21 LAB — RESP PANEL BY RT-PCR (RSV, FLU A&B, COVID)  RVPGX2
Influenza A by PCR: NEGATIVE
Influenza B by PCR: NEGATIVE
Resp Syncytial Virus by PCR: NEGATIVE
SARS Coronavirus 2 by RT PCR: NEGATIVE

## 2022-06-21 MED ORDER — PREDNISONE 20 MG PO TABS: 60.0000 mg | ORAL_TABLET | ORAL | Status: DC

## 2022-06-21 MED ORDER — METHYLPREDNISOLONE SODIUM SUCC 125 MG IJ SOLR
125.0000 mg | INTRAMUSCULAR | Status: AC
  Administered 2022-06-21: 125 mg via INTRAVENOUS
  Filled 2022-06-21: qty 2

## 2022-06-21 MED ORDER — ALBUTEROL SULFATE (2.5 MG/3ML) 0.083% IN NEBU: 5.0000 mg | INHALATION_SOLUTION | Freq: Once | RESPIRATORY_TRACT | Status: DC

## 2022-06-21 MED ORDER — ALBUTEROL SULFATE (2.5 MG/3ML) 0.083% IN NEBU
2.5000 mg | INHALATION_SOLUTION | Freq: Once | RESPIRATORY_TRACT | Status: AC
  Administered 2022-06-21: 10 mg via RESPIRATORY_TRACT
  Filled 2022-06-21: qty 3

## 2022-06-21 MED ORDER — ALBUTEROL SULFATE HFA 108 (90 BASE) MCG/ACT IN AERS: 2.0000 | INHALATION_SPRAY | RESPIRATORY_TRACT | Status: DC | PRN

## 2022-06-21 MED ORDER — ALBUTEROL (5 MG/ML) CONTINUOUS INHALATION SOLN: 10.0000 mg/h | INHALATION_SOLUTION | Freq: Once | RESPIRATORY_TRACT | Status: DC

## 2022-06-21 MED ORDER — IPRATROPIUM-ALBUTEROL 0.5-2.5 (3) MG/3ML IN SOLN
3.0000 mL | Freq: Once | RESPIRATORY_TRACT | Status: AC
  Administered 2022-06-21: 3 mL via RESPIRATORY_TRACT
  Filled 2022-06-21: qty 3

## 2022-06-21 MED ORDER — IPRATROPIUM-ALBUTEROL 0.5-2.5 (3) MG/3ML IN SOLN: 3.0000 mL | Freq: Every day | RESPIRATORY_TRACT | 0 refills | Status: AC

## 2022-06-21 MED ORDER — PREDNISONE 20 MG PO TABS: 40.0000 mg | ORAL_TABLET | Freq: Every day | ORAL | 0 refills | Status: DC

## 2022-06-21 NOTE — ED Provider Notes (Signed)
[facility] EMERGENCY DEPARTMENT Provider Note   CSN: [id number] Arrival date & time: 06/21/22  0741     History  Chief Complaint  Patient presents with   Shortness of Breath    Tina Grant is a 31 y.o. female.  HPI Patient presents with dyspnea, wheezing.  Onset was possible in the last 24 hours.  She has used albuterol, no relief.  Patient states that she is generally well aside from asthma.    Home Medications Prior to Admission medications   Medication Sig Start Date End Date Taking? Authorizing Provider  ipratropium-albuterol (DUONEB) 0.5-2.5 (3) MG/3ML SOLN Take 3 mLs by nebulization daily for 7 doses. 06/21/22 06/28/22 Yes Carmin Muskrat, MD  predniSONE (DELTASONE) 20 MG tablet Take 2 tablets (40 mg total) by mouth daily with breakfast. For the next four days 06/21/22  Yes Carmin Muskrat, MD  albuterol (PROVENTIL) (2.5 MG/3ML) 0.083% nebulizer solution Take 3 mLs (2.5 mg total) by nebulization every 6 (six) hours as needed for wheezing or shortness of breath. 11/20/19   Elnora Morrison, MD  budesonide-formoterol (SYMBICORT) 80-4.5 MCG/ACT inhaler Inhale 2 puffs into the lungs 2 (two) times daily. Patient not taking: Reported on 04/08/2022    [provider]  cefadroxil (DURICEF) 500 MG capsule Take 1 capsule (500 mg total) by mouth 2 (two) times daily. 04/11/22   Gavin Pound, CNM  celecoxib (CELEBREX) 50 MG capsule Take 50 mg by mouth 2 (two) times daily.    [provider]  cyclobenzaprine (FLEXERIL) 10 MG tablet Take 10 mg by mouth 3 (three) times daily as needed for muscle spasms.    [provider]  EPINEPHrine 0.3 mg/0.3 mL IJ SOAJ injection Inject 0.3 mg into the muscle as needed for anaphylaxis. 05/18/20   Roosevelt Locks, MD  escitalopram (LEXAPRO) 5 MG tablet Take 1 tablet by mouth daily. 05/25/21   [provider]  etonogestrel (NEXPLANON) 68 MG IMPL implant 68 mg by Subdermal route once.    [provider]  FLOVENT HFA 110 MCG/ACT inhaler Inhale 1 puff into the lungs 2 (two) times a day.  Patient not taking: Reported on 02/05/2021 11/29/18   [provider]  ipratropium-albuterol (DUONEB) 0.5-2.5 (3) MG/3ML SOLN SMARTSIG:3 Milliliter(s) Via Nebulizer Every 6 Hours Patient not taking: Reported on 02/05/2021 10/13/19   [provider]  JANUMET 50-1000 MG tablet Take 1 tablet by mouth 2 (two) times daily. 02/15/22   [provider]  metFORMIN (GLUCOPHAGE) 500 MG tablet Take 500 mg by mouth 2 (two) times daily with a meal. Patient not taking: Reported on 04/08/2022    [provider]  metroNIDAZOLE (FLAGYL) 500 MG tablet Take 1 tablet (500 mg total) by mouth 2 (two) times daily. 04/09/22   Gavin Pound, CNM  montelukast (SINGULAIR) 10 MG tablet Take 10 mg by mouth at bedtime.     [provider]  phentermine (ADIPEX-P) 37.5 MG tablet Half to one full tablet every morning Patient not taking: Reported on 02/05/2021 01/01/20   [provider]  PROAIR HFA 108 (90 Base) MCG/ACT inhaler Inhale 2 puffs into the lungs every 4 (four) hours as needed for wheezing or shortness of breath. 11/20/19   Elnora Morrison, MD  topiramate (TOPAMAX) 25 MG tablet Take 25 mg by mouth daily. Patient not taking: Reported on 02/05/2021 01/01/20   [provider]      Allergies    Shellfish allergy and Iodine    Review of Systems   Review of  Systems  All other systems reviewed and are negative.   Physical Exam Updated Vital Signs BP (!) 125/91 (BP Location: Left Wrist)   Pulse (!) 108   Temp 98.2 F (36.8 C) (Oral)   Resp 20   Ht 5\' 6"  (1.676 m)   Wt (!) 172.4 kg   SpO2 99%   BMI 61.33 kg/m  Physical Exam Vitals and nursing note reviewed.  Constitutional:      General: She is not in acute distress.    Appearance: She is well-developed. She is obese.  HENT:     Head: Normocephalic and atraumatic.  Eyes:     Conjunctiva/sclera: Conjunctivae  normal.  Cardiovascular:     Rate and Rhythm: Regular rhythm. Tachycardia present.  Pulmonary:     Effort: Pulmonary effort is normal. Tachypnea present. No respiratory distress.     Breath sounds: No stridor. Decreased breath sounds and wheezing present.  Abdominal:     General: There is no distension.  Skin:    General: Skin is warm and dry.  Neurological:     Mental Status: She is alert and oriented to person, place, and time.     Cranial Nerves: No cranial nerve deficit.  Psychiatric:        Mood and Affect: Mood normal.     ED Results / Procedures / Treatments   Labs (all labs ordered are listed, but only abnormal results are displayed) Labs Reviewed  RESP PANEL BY RT-PCR (RSV, FLU A&B, COVID)  [id number]    EKG None  Radiology DG Chest 2 View  Result Date: 06/21/2022 CLINICAL DATA:  Shortness of breath, dyspnea, chest tightness, symptoms in spite of using home albuterol, history asthma EXAM: CHEST - 2 VIEW COMPARISON:  05/18/2020 FINDINGS: Upper normal heart size. Mediastinal contours and pulmonary vascularity normal. Lungs clear. No pulmonary infiltrate, pleural effusion, or pneumothorax. Osseous structures unremarkable. IMPRESSION: No acute abnormalities. Electronically Signed   By: 05/20/2020 M.D.   On: 06/21/2022 09:14    Procedures Procedures    Medications Ordered in ED Medications  albuterol (VENTOLIN HFA) 108 (90 Base) MCG/ACT inhaler 2 puff (has no administration in time range)  ipratropium-albuterol (DUONEB) 0.5-2.5 (3) MG/3ML nebulizer solution 3 mL (3 mLs Nebulization Given 06/21/22 1209)  albuterol (PROVENTIL) (2.5 MG/3ML) 0.083% nebulizer solution 2.5 mg (10 mg Nebulization Given 06/21/22 1306)  methylPREDNISolone sodium succinate (SOLU-MEDROL) 125 mg/2 mL injection 125 mg (125 mg Intravenous Given 06/21/22 1353)    ED Course/ Medical Decision Making/ A&P This patient with a Hx of obesity, asthma presents to the ED for concern of apnea, wheezing, this  involves an extensive number of treatment options, and is a complaint that carries with it a high risk of complications and morbidity.    The differential diagnosis includes asthma exacerbation, viral syndrome, pneumonia, bacteremia, sepsis   Social Determinants of Health:  Obesity  Additional history obtained:  Additional history and/or information obtained from chart review, notable for asthma control efforts   After the initial evaluation, orders, including: Bronchodilators, x-ray, labs were initiated.   Patient placed on Cardiac and Pulse-Oximetry Monitors. The patient was maintained on a cardiac monitor.  The cardiac monitored showed an rhythm of initially tachycardic, 110, after multiple treatments patient's heart rate was below 100, sinus, unremarkable The patient was also maintained on pulse oximetry. The readings were typically 98% room air unremarkable   On repeat evaluation of the patient improved  Lab Tests:  I personally interpreted labs.  The pertinent results include: COVID-negative,  flu negative  Imaging Studies ordered:  I independently visualized and interpreted imaging which showed no pneumonia on x-ray I agree with the radiologist interpretation   Dispostion / Final MDM:  After consideration of the diagnostic results and the patient's response to treatment, this adult female with history of asthma presents with dyspnea, is wheezing initially.  She is mildly tachycardic, tachypneic, but has no hypoxia, no evidence for bacteremia, sepsis.  Patient required multiple breathing treatments, IV steroids, but eventually was improved substantially, with heart rate less than 100, was awake, alert, sitting upright, pleasantly interactive, was appropriate for discharge the hospitalization was a consideration given the number of bronchodilators required.  Final Clinical Impression(s) / ED Diagnoses Final diagnoses:  SOB (shortness of breath)    Rx / DC Orders ED  Discharge Orders          Ordered    predniSONE (DELTASONE) 20 MG tablet  Daily with breakfast        06/21/22 1433    ipratropium-albuterol (DUONEB) 0.5-2.5 (3) MG/3ML SOLN  Daily        06/21/22 1433              Carmin Muskrat, MD 06/21/22 1548

## 2022-06-21 NOTE — ED Triage Notes (Signed)
Pt arrived POV from home c/o [facility]. Pt states she has asthma and has used her inhaler and neb treatments all night with no relief.

## 2022-06-21 NOTE — Discharge Instructions (Addendum)
As discussed, with your respiratory difficulty it is important to monitor your condition carefully and follow-up with your physician.  Please use the provided DuoNeb nebulizer treatment daily for the next 7 days.  In addition, used albuterol either nebulized or via inhaler every 4 hours for the next 2 days, then as needed.  Return here for concerning changes in your condition.

## 2022-06-21 NOTE — ED Provider Triage Note (Signed)
Emergency Medicine Provider Triage Evaluation Note  Tina Grant , a 31 y.o. female  was evaluated in triage.  Pt complains of dyspnea, chest tightness in spite of using her home albuterol.  No recent steroid use.  No chest pain, no fever.  Review of Systems  Positive: HPI Negative: No fever, no chest pain  Physical Exam  BP (!) 140/106 (BP Location: Right Arm)   Pulse (!) 119   Temp 99.3 F (37.4 C) (Oral)   Resp 20   Ht 5\' 6"  (1.676 m)   Wt (!) 172.4 kg   SpO2 97%   BMI 61.33 kg/m  Gen:   Awake, no distress speaking clearly Resp:  Normal effort audible wheezing bilaterally MSK:   Moves extremities without difficulty no deformity, no obvious edema Other:  Neuro unremarkable  Medical Decision Making  Medically screening exam initiated at 8:34 AM.  Appropriate orders placed.  was informed that the remainder of the evaluation will be completed by another provider, this initial triage assessment does not replace that evaluation, and the importance of remaining in the ED until their evaluation is complete.   Koleen Distance, MD 06/21/22 [phone]

## 2022-06-27 ENCOUNTER — Inpatient Hospital Stay (HOSPITAL_COMMUNITY)
Admission: EM | Admit: 2022-06-27 | Discharge: 2022-06-28 | DRG: 922 | Disposition: A | Discharge status: Home / Self Care | Payer: Medicaid Other | Attending: Pulmonary Disease | Admitting: Pulmonary Disease

## 2022-06-27 ENCOUNTER — Other Ambulatory Visit: Payer: Self-pay

## 2022-06-27 ENCOUNTER — Encounter (HOSPITAL_COMMUNITY): Payer: Self-pay | Admitting: Pulmonary Disease

## 2022-06-27 ENCOUNTER — Emergency Department (HOSPITAL_COMMUNITY): Payer: Medicaid Other

## 2022-06-27 DIAGNOSIS — J45901 Unspecified asthma with (acute) exacerbation: Secondary | ICD-10-CM | POA: Diagnosis present

## 2022-06-27 DIAGNOSIS — R062 Wheezing: Secondary | ICD-10-CM

## 2022-06-27 DIAGNOSIS — R739 Hyperglycemia, unspecified: Secondary | ICD-10-CM | POA: Diagnosis present

## 2022-06-27 DIAGNOSIS — Z91041 Radiographic dye allergy status: Secondary | ICD-10-CM

## 2022-06-27 DIAGNOSIS — F419 Anxiety disorder, unspecified: Secondary | ICD-10-CM | POA: Diagnosis present

## 2022-06-27 DIAGNOSIS — Z7951 Long term (current) use of inhaled steroids: Secondary | ICD-10-CM

## 2022-06-27 DIAGNOSIS — X58XXXA Exposure to other specified factors, initial encounter: Secondary | ICD-10-CM | POA: Diagnosis present

## 2022-06-27 DIAGNOSIS — Z6841 Body Mass Index (BMI) 40.0 and over, adult: Secondary | ICD-10-CM

## 2022-06-27 DIAGNOSIS — J9600 Acute respiratory failure, unspecified whether with hypoxia or hypercapnia: Secondary | ICD-10-CM | POA: Clinically undetermined

## 2022-06-27 DIAGNOSIS — Z8249 Family history of ischemic heart disease and other diseases of the circulatory system: Secondary | ICD-10-CM

## 2022-06-27 DIAGNOSIS — Z79899 Other long term (current) drug therapy: Secondary | ICD-10-CM

## 2022-06-27 DIAGNOSIS — T7840XA Allergy, unspecified, initial encounter: Secondary | ICD-10-CM | POA: Diagnosis present

## 2022-06-27 DIAGNOSIS — Z91199 Patient's noncompliance with other medical treatment and regimen due to unspecified reason: Secondary | ICD-10-CM

## 2022-06-27 DIAGNOSIS — J4541 Moderate persistent asthma with (acute) exacerbation: Secondary | ICD-10-CM

## 2022-06-27 DIAGNOSIS — Z7984 Long term (current) use of oral hypoglycemic drugs: Secondary | ICD-10-CM

## 2022-06-27 DIAGNOSIS — Z91013 Allergy to seafood: Secondary | ICD-10-CM

## 2022-06-27 DIAGNOSIS — F17210 Nicotine dependence, cigarettes, uncomplicated: Secondary | ICD-10-CM | POA: Diagnosis present

## 2022-06-27 DIAGNOSIS — T781XXA Other adverse food reactions, not elsewhere classified, initial encounter: Principal | ICD-10-CM | POA: Diagnosis present

## 2022-06-27 LAB — I-STAT CHEM 8, ED
BUN: 6 mg/dL (ref 6–20)
Calcium, Ion: 0.98 mmol/L — ABNORMAL LOW (ref 1.15–1.40)
Chloride: 108 mmol/L (ref 98–111)
Creatinine, Ser: 0.4 mg/dL — ABNORMAL LOW (ref 0.44–1.00)
Glucose, Bld: 206 mg/dL — ABNORMAL HIGH (ref 70–99)
HCT: 38 % (ref 36.0–46.0)
Hemoglobin: 12.9 g/dL (ref 12.0–15.0)
Potassium: 4.1 mmol/L (ref 3.5–5.1)
Sodium: 138 mmol/L (ref 135–145)
TCO2: 22 mmol/L (ref 22–32)

## 2022-06-27 LAB — I-STAT VENOUS BLOOD GAS, ED
Acid-base deficit: 1 mmol/L (ref 0.0–2.0)
Bicarbonate: 22.3 mmol/L (ref 20.0–28.0)
Calcium, Ion: 1.02 mmol/L — ABNORMAL LOW (ref 1.15–1.40)
HCT: 37 % (ref 36.0–46.0)
Hemoglobin: 12.6 g/dL (ref 12.0–15.0)
O2 Saturation: 98 %
Potassium: 4.1 mmol/L (ref 3.5–5.1)
Sodium: 138 mmol/L (ref 135–145)
TCO2: 23 mmol/L (ref 22–32)
pCO2, Ven: 33.7 mmHg — ABNORMAL LOW (ref 44–60)
pH, Ven: 7.43 (ref 7.25–7.43)
pO2, Ven: 97 mmHg — ABNORMAL HIGH (ref 32–45)

## 2022-06-27 LAB — MAGNESIUM: Magnesium: 2.3 mg/dL (ref 1.7–2.4)

## 2022-06-27 LAB — BASIC METABOLIC PANEL
Anion gap: 9 (ref 5–15)
BUN: 6 mg/dL (ref 6–20)
CO2: 24 mmol/L (ref 22–32)
Calcium: 8.4 mg/dL — ABNORMAL LOW (ref 8.9–10.3)
Chloride: 106 mmol/L (ref 98–111)
Creatinine, Ser: 0.63 mg/dL (ref 0.44–1.00)
GFR, Estimated: >60 mL/min (ref >60)
Glucose, Bld: 208 mg/dL — ABNORMAL HIGH (ref 70–99)
Potassium: 4 mmol/L (ref 3.5–5.1)
Sodium: 139 mmol/L (ref 135–145)

## 2022-06-27 LAB — CBC
HCT: 37.8 % (ref 36.0–46.0)
Hemoglobin: 12.3 g/dL (ref 12.0–15.0)
MCH: 25.6 pg — ABNORMAL LOW (ref 26.0–34.0)
MCHC: 32.5 g/dL (ref 30.0–36.0)
MCV: 78.6 fL — ABNORMAL LOW (ref 80.0–100.0)
Platelets: 460 10*3/uL — ABNORMAL HIGH (ref 150–400)
RBC: 4.81 MIL/uL (ref 3.87–5.11)
RDW: 16.1 % — ABNORMAL HIGH (ref 11.5–15.5)
WBC: 11.8 10*3/uL — ABNORMAL HIGH (ref 4.0–10.5)
nRBC: 0 % (ref 0.0–0.2)

## 2022-06-27 LAB — GLUCOSE, CAPILLARY
Glucose-Capillary: 241 mg/dL — ABNORMAL HIGH (ref 70–99)
Glucose-Capillary: 245 mg/dL — ABNORMAL HIGH (ref 70–99)
Glucose-Capillary: 223 mg/dL — ABNORMAL HIGH (ref 70–99)

## 2022-06-27 LAB — I-STAT BETA HCG BLOOD, ED (MC, WL, AP ONLY): I-stat hCG, quantitative: <5 m[IU]/mL (ref <5)

## 2022-06-27 LAB — TROPONIN I (HIGH SENSITIVITY): Troponin I (High Sensitivity): 5 ng/L (ref <18)

## 2022-06-27 LAB — BRAIN NATRIURETIC PEPTIDE: B Natriuretic Peptide: 20.4 pg/mL (ref 0.0–100.0)

## 2022-06-27 LAB — PHOSPHORUS: Phosphorus: 3.5 mg/dL (ref 2.5–4.6)

## 2022-06-27 MED ORDER — POLYETHYLENE GLYCOL 3350 17 G PO PACK: 17.0000 g | PACK | Freq: Every day | ORAL | Status: DC | PRN

## 2022-06-27 MED ORDER — ONDANSETRON HCL 4 MG/2ML IJ SOLN: 4.0000 mg | Freq: Four times a day (QID) | INTRAMUSCULAR | Status: DC | PRN

## 2022-06-27 MED ORDER — MONTELUKAST SODIUM 10 MG PO TABS
10.0000 mg | ORAL_TABLET | Freq: Every day | ORAL | Status: DC
  Administered 2022-06-27: 10 mg via ORAL
  Filled 2022-06-27: qty 1

## 2022-06-27 MED ORDER — ALBUTEROL SULFATE (2.5 MG/3ML) 0.083% IN NEBU
INHALATION_SOLUTION | RESPIRATORY_TRACT | Status: AC
  Filled 2022-06-27: qty 6

## 2022-06-27 MED ORDER — FAMOTIDINE 20 MG PO TABS
40.0000 mg | ORAL_TABLET | Freq: Every day | ORAL | Status: DC
  Administered 2022-06-28: 40 mg via ORAL
  Filled 2022-06-27: qty 2

## 2022-06-27 MED ORDER — EPINEPHRINE 0.3 MG/0.3ML IJ SOAJ
0.3000 mg | Freq: Once | INTRAMUSCULAR | Status: AC
  Administered 2022-06-27: 0.3 mg via INTRAMUSCULAR
  Filled 2022-06-27: qty 0.3

## 2022-06-27 MED ORDER — INSULIN ASPART 100 UNIT/ML IJ SOLN
0.0000 [IU] | INTRAMUSCULAR | Status: DC
  Administered 2022-06-27 (×3): 5 [IU] via SUBCUTANEOUS
  Administered 2022-06-28 (×2): 3 [IU] via SUBCUTANEOUS

## 2022-06-27 MED ORDER — METHYLPREDNISOLONE SODIUM SUCC 125 MG IJ SOLR
60.0000 mg | Freq: Two times a day (BID) | INTRAMUSCULAR | Status: DC
  Administered 2022-06-27 – 2022-06-28 (×2): 60 mg via INTRAVENOUS
  Filled 2022-06-27 (×2): qty 2

## 2022-06-27 MED ORDER — ACETAMINOPHEN 325 MG PO TABS
650.0000 mg | ORAL_TABLET | Freq: Four times a day (QID) | ORAL | Status: DC | PRN
  Administered 2022-06-27 – 2022-06-28 (×2): 650 mg via ORAL
  Filled 2022-06-27 (×2): qty 2

## 2022-06-27 MED ORDER — BUDESONIDE 0.25 MG/2ML IN SUSP
0.2500 mg | Freq: Two times a day (BID) | RESPIRATORY_TRACT | Status: DC
  Administered 2022-06-27 – 2022-06-28 (×2): 0.25 mg via RESPIRATORY_TRACT
  Filled 2022-06-27 (×2): qty 2

## 2022-06-27 MED ORDER — ESCITALOPRAM OXALATE 10 MG PO TABS
5.0000 mg | ORAL_TABLET | Freq: Every day | ORAL | Status: DC
  Administered 2022-06-28: 5 mg via ORAL
  Filled 2022-06-27: qty 1

## 2022-06-27 MED ORDER — DEXTROSE-NACL 5-0.45 % IV SOLN: INTRAVENOUS | Status: DC

## 2022-06-27 MED ORDER — HEPARIN SODIUM (PORCINE) 5000 UNIT/ML IJ SOLN
5000.0000 [IU] | Freq: Three times a day (TID) | INTRAMUSCULAR | Status: DC
  Administered 2022-06-27 – 2022-06-28 (×4): 5000 [IU] via SUBCUTANEOUS
  Filled 2022-06-27 (×4): qty 1

## 2022-06-27 MED ORDER — FAMOTIDINE IN NACL 20-0.9 MG/50ML-% IV SOLN
20.0000 mg | Freq: Once | INTRAVENOUS | Status: AC
  Administered 2022-06-27: 20 mg via INTRAVENOUS
  Filled 2022-06-27: qty 50

## 2022-06-27 MED ORDER — METHYLPREDNISOLONE SODIUM SUCC 125 MG IJ SOLR: 60.0000 mg | Freq: Two times a day (BID) | INTRAMUSCULAR | Status: DC

## 2022-06-27 MED ORDER — DOCUSATE SODIUM 100 MG PO CAPS: 100.0000 mg | ORAL_CAPSULE | Freq: Two times a day (BID) | ORAL | Status: DC | PRN

## 2022-06-27 MED ORDER — ALBUTEROL (5 MG/ML) CONTINUOUS INHALATION SOLN
5.0000 mg/h | INHALATION_SOLUTION | Freq: Once | RESPIRATORY_TRACT | Status: AC
  Administered 2022-06-27: 5 mg/h via RESPIRATORY_TRACT
  Filled 2022-06-27: qty 6

## 2022-06-27 MED ORDER — LORAZEPAM 2 MG/ML IJ SOLN
0.5000 mg | Freq: Once | INTRAMUSCULAR | Status: AC
  Administered 2022-06-27: 0.5 mg via INTRAVENOUS
  Filled 2022-06-27: qty 1

## 2022-06-27 MED ORDER — IPRATROPIUM-ALBUTEROL 0.5-2.5 (3) MG/3ML IN SOLN
3.0000 mL | RESPIRATORY_TRACT | Status: DC
  Administered 2022-06-27 – 2022-06-28 (×5): 3 mL via RESPIRATORY_TRACT
  Filled 2022-06-27 (×5): qty 3

## 2022-06-27 NOTE — Progress Notes (Signed)
RT attempted ABG x2 with no success. Dr. Francine Graven made aware at this time.

## 2022-06-27 NOTE — ED Provider Notes (Signed)
[facility] EMERGENCY DEPARTMENT Provider Note   CSN: [id number] Arrival date & time: 06/27/22  1226     History  Chief Complaint  Patient presents with   Allergic Reaction    Tina Grant is a 31 y.o. female.  HPI 31 year old female arrives via EMS with reports of allergic reaction to shellfish.  Family reports that she was washing some pots which had contained shellfish.  She had an acute onset of shortness of breath with wheezing.  Prehospital she received epi, 10 of albuterol, Atrovent, Solu-Medrol, 2 mg of magnesium.  Patient also has a history of recent asthma exacerbation.  Heart rate increased to 140.  Patient continues dyspneic.  She is complaining of some anterior chest pain.  Denies any rash, lightheadedness, or passing out.    Home Medications Prior to Admission medications   Medication Sig Start Date End Date Taking? Authorizing Provider  albuterol (PROVENTIL) (2.5 MG/3ML) 0.083% nebulizer solution Take 3 mLs (2.5 mg total) by nebulization every 6 (six) hours as needed for wheezing or shortness of breath. 11/20/19   Blane Ohara, MD  budesonide-formoterol (SYMBICORT) 80-4.5 MCG/ACT inhaler Inhale 2 puffs into the lungs 2 (two) times daily. Patient not taking: Reported on 04/08/2022    [provider]  cefadroxil (DURICEF) 500 MG capsule Take 1 capsule (500 mg total) by mouth 2 (two) times daily. 04/11/22   Gerrit Heck, CNM  celecoxib (CELEBREX) 50 MG capsule Take 50 mg by mouth 2 (two) times daily.    [provider]  cyclobenzaprine (FLEXERIL) 10 MG tablet Take 10 mg by mouth 3 (three) times daily as needed for muscle spasms.    [provider]  EPINEPHrine 0.3 mg/0.3 mL IJ SOAJ injection Inject 0.3 mg into the muscle as needed for anaphylaxis. 05/18/20   Brantley Fling, MD  escitalopram (LEXAPRO) 5 MG tablet Take 1 tablet by mouth daily. 05/25/21   [provider]  etonogestrel (NEXPLANON) 68 MG IMPL implant 68 mg  by Subdermal route once.    [provider]  FLOVENT HFA 110 MCG/ACT inhaler Inhale 1 puff into the lungs 2 (two) times a day.  Patient not taking: Reported on 02/05/2021 11/29/18   [provider]  ipratropium-albuterol (DUONEB) 0.5-2.5 (3) MG/3ML SOLN SMARTSIG:3 Milliliter(s) Via Nebulizer Every 6 Hours Patient not taking: Reported on 02/05/2021 10/13/19   [provider]  ipratropium-albuterol (DUONEB) 0.5-2.5 (3) MG/3ML SOLN Take 3 mLs by nebulization daily for 7 doses. 06/21/22 06/28/22  Gerhard Munch, MD  JANUMET 50-1000 MG tablet Take 1 tablet by mouth 2 (two) times daily. 02/15/22   [provider]  metFORMIN (GLUCOPHAGE) 500 MG tablet Take 500 mg by mouth 2 (two) times daily with a meal. Patient not taking: Reported on 04/08/2022    [provider]  metroNIDAZOLE (FLAGYL) 500 MG tablet Take 1 tablet (500 mg total) by mouth 2 (two) times daily. 04/09/22   Gerrit Heck, CNM  montelukast (SINGULAIR) 10 MG tablet Take 10 mg by mouth at bedtime.     [provider]  phentermine (ADIPEX-P) 37.5 MG tablet Half to one full tablet every morning Patient not taking: Reported on 02/05/2021 01/01/20   [provider]  predniSONE (DELTASONE) 20 MG tablet Take 2 tablets (40 mg total) by mouth daily with breakfast. For the next four days 06/21/22   Gerhard Munch, MD  [facility] HFA 108 [phone] Base) MCG/ACT inhaler Inhale 2 puffs into the lungs every 4 (four) hours as needed for wheezing or shortness  of breath. 11/20/19   Blane Ohara, MD  topiramate (TOPAMAX) 25 MG tablet Take 25 mg by mouth daily. Patient not taking: Reported on 02/05/2021 01/01/20   [provider]      Allergies    Shellfish allergy and Iodine    Review of Systems   Review of Systems  Physical Exam Updated Vital Signs BP (!) 151/62   Pulse (!) 126   Temp 98.6 F (37 C) (Axillary)   Resp 19   Ht 1.676 m (5\' 6" )   Wt (!) 172.4 kg   SpO2 99%   BMI 61.33 kg/m   Physical Exam Vitals and nursing note reviewed.  Constitutional:      General: She is in acute distress.     Appearance: Normal appearance. She is obese. She is ill-appearing.  HENT:     Head: Normocephalic.     Right Ear: External ear normal.     Left Ear: External ear normal.     Nose: Nose normal.  Eyes:     Extraocular Movements: Extraocular movements intact.     Pupils: Pupils are equal, round, and reactive to light.  Cardiovascular:     Rate and Rhythm: Regular rhythm. Tachycardia present.     Pulses: Normal pulses.  Pulmonary:     Effort: Respiratory distress present.     Comments: Decreased breath sounds throughout with some inspiratory and expiratory wheezes Abdominal:     General: Bowel sounds are normal.     Palpations: Abdomen is soft.  Musculoskeletal:        General: Normal range of motion.     Cervical back: Normal range of motion.  Neurological:     Mental Status: She is alert.     ED Results / Procedures / Treatments   Labs (all labs ordered are listed, but only abnormal results are displayed) Labs Reviewed  BASIC METABOLIC PANEL - Abnormal; Notable for the following components:      Result Value   Glucose, Bld 208 (*)    Calcium 8.4 (*)    All other components within normal limits  CBC - Abnormal; Notable for the following components:   WBC 11.8 (*)    MCV 78.6 (*)    MCH 25.6 (*)    RDW 16.1 (*)    Platelets 460 (*)    All other components within normal limits  I-STAT VENOUS BLOOD GAS, ED - Abnormal; Notable for the following components:   pCO2, Ven 33.7 (*)    pO2, Ven 97 (*)    Calcium, Ion 1.02 (*)    All other components within normal limits  I-STAT CHEM 8, ED - Abnormal; Notable for the following components:   Creatinine, Ser 0.40 (*)    Glucose, Bld 206 (*)    Calcium, Ion 0.98 (*)    All other components within normal limits  HEMOGLOBIN A1C  I-STAT BETA HCG BLOOD, ED ([facility], [facility], [facility] ONLY)  I-STAT ARTERIAL BLOOD GAS, ED  TROPONIN I  (HIGH SENSITIVITY)    EKG EKG Interpretation  Date/Time:  Sunday June 27 2022 12:35:54 EST Ventricular Rate:  131 PR Interval:  137 QRS Duration: 84 QT Interval:  305 QTC Calculation: 451 R Axis:   41 Text Interpretation: Sinus tachycardia Confirmed by 01-17-1994 [phone]) on 06/27/2022 1:39:49 PM  Radiology DG Chest Portable 1 View  Result Date: 06/27/2022 CLINICAL DATA:  SOB EXAM: PORTABLE CHEST 1 VIEW COMPARISON:  June 21, 2022 FINDINGS: The cardiomediastinal silhouette is unchanged in contour. No pleural  effusion. No pneumothorax. No acute pleuroparenchymal abnormality. IMPRESSION: No acute cardiopulmonary abnormality. Electronically Signed   By: Meda Klinefelter M.D.   On: 06/27/2022 13:08    Procedures .Critical Care  Performed by: Margarita Grizzle, MD Authorized by: Margarita Grizzle, MD   Critical care provider statement:    Critical care time (minutes):  75   Critical care was necessary to treat or prevent imminent or life-threatening deterioration of the following conditions:  Respiratory failure   Critical care was time spent personally by me on the following activities:  Development of treatment plan with patient or surrogate, discussions with consultants, evaluation of patient's response to treatment, examination of patient, ordering and review of laboratory studies, ordering and review of radiographic studies, ordering and performing treatments and interventions, pulse oximetry, re-evaluation of patient's condition and review of old charts     Medications Ordered in ED Medications  famotidine (PEPCID) IVPB 20 mg premix (0 mg Intravenous Stopped 06/27/22 1331)  LORazepam (ATIVAN) injection 0.5 mg (0.5 mg Intravenous Given 06/27/22 1247)  albuterol (PROVENTIL,VENTOLIN) solution continuous neb (5 mg/hr Nebulization Given 06/27/22 1246)  albuterol (PROVENTIL) (2.5 MG/3ML) 0.083% nebulizer solution (  Given 06/27/22 1308)  EPINEPHrine (EPI-PEN) injection 0.3 mg  (0.3 mg Intramuscular Given 06/27/22 1338)    ED Course/ Medical Decision Making/ A&P Clinical Course as of 06/27/22 1439  Sun Jun 27, 2022  1339 Chest x-Keilly Fatula reviewed interpreted no evidence of acute abnormality noted and radiologist interpretation concurs [DR]    Clinical Course User Index [DR] Margarita Grizzle, MD                           Medical Decision Making 31 year old female presents to ED after 1 hour wheezing and respiratory distress thought to be secondary to exposure to known allergen of shellfish. Patient has had decreased breath sounds with wheezing.  She has received epinephrine, albuterol, Solu-Medrol, Benadryl, magnesium. Patient was observed here with additional 10 mg of albuterol, and additional famotidine, on BiPAP.  She continues to be tachycardic and have difficulty breathing.  She is complaining of anterior chest pain. Patient maintained on monitor. She received 0.5 mg of Ativan She has been observed for 1.5 hours She continues to have inspiratory and expiratory wheezes. ABG is currently pending Repeat epinephrine dose has been ordered Patient continues to be significantly dyspneic She continues to maintain adequate blood pressure 133/91, be tachycardic, and sats with the BiPAP and oxygen in place are at 99% Plan consultation to critical care for ongoing evaluation and treatment   Amount and/or Complexity of Data Reviewed Labs: ordered. Decision-making details documented in ED Course. Radiology: ordered and independent interpretation performed. Decision-making details documented in ED Course. ECG/medicine tests: ordered and independent interpretation performed. Decision-making details documented in ED Course.    Details: Patient maintained on monitor with frequent reevaluations has remained tachycardic and tachypneic but has maintained systolic blood pressures greater than 125 Discussion of management or test interpretation with external provider(s): Consult  critical care, Sarah.  They will see for evaluation  Risk Prescription drug management. Parenteral controlled substances.           Final Clinical Impression(s) / ED Diagnoses Final diagnoses:  Allergic reaction, initial encounter  Wheezing  Acute respiratory failure, unspecified whether with hypoxia or hypercapnia [facility])    Rx / DC Orders ED Discharge Orders     None         Margarita Grizzle, MD 06/27/22 1439

## 2022-06-27 NOTE — Progress Notes (Signed)
Pt transported on BIPAP to [facility]. No complications noted.

## 2022-06-27 NOTE — ED Triage Notes (Signed)
Pt BIB EMS due to allergic reaction. Pt is allergic to shellfish and cleaned a pot w shellfish. Pt had SHOB/ wheezing. Pt arrives with cpap. Pt received 0.6 mg of epi IM. 10 of albuterol. 1 of atrovent. 125 solu-medrol. 2mg  of mag. Pt has hx of anxiety and asthma. Pt anxious on arrival. Pt axox4. HR 140 after epi.

## 2022-06-27 NOTE — H&P (Signed)
NAME:  Tina Grant, MRN:  [id number], DOB:  10/29/1990, LOS: 0 ADMISSION DATE:  06/27/2022, CONSULTATION DATE:  06/27/2022 REFERRING MD:  Dr. Wallace CullensGray, CHIEF COMPLAINT:     History of Present Illness:  Tina Grant is a 31 year old female with history of asthma, anxiety and shell fish allergy who presented to the ED 06/27/2022 with acute allergic reaction after cleaning a pot that had been used for shell fish.  She developed progressive shortness of breath and wheezing. She gave herself She called EMS. En Route she was given 0.6 mg of epi IM. 10 of albuterol. 1 of atrovent. 125 solu-medrol. 2g of magnesium and benadryl.   In the ED she was still working hard to breath and was given additional epi, albuterol and Pepcid.  She was placed on BiPAP with relief in her work of breathing.   PCCM called to admit the patient for allergic reaction to shell fish with asthma exacerbation.  Patient was in ER on 12/18 for shortness of breath and treated for asthma exacerbation. She never picked up her prescription for prednisone and never felt like her breathing got back to baseline before today's symptoms. She does not reliably use her maintenance inhalers.   She reports feeling a bit better since her treatments in EMS and ER. Her sister is with her in the ER.  Pertinent  Medical History   Past Medical History:  Diagnosis Date   Anemia    history of   Asthma    Complication of anesthesia    Dyspnea    Gestational diabetes    diet controlled   Infection    uti   MRSA infection      Significant Hospital Events: Including procedures, antibiotic start and stop dates in addition to other pertinent events   [facility]22 [facility] reaction to shell fish  Interim History / Subjective:  Improving, on BiPAP Talking in full sentences  Objective   Blood pressure (!) 151/62, pulse (!) 126, temperature 98.6 F (37 C), temperature source Axillary, resp. rate 19, height 5\' 6"  (1.676 m),  weight (!) 172.4 kg, SpO2 99 %.    FiO2 (%):  [40 %] 40 %  No intake or output data in the 24 hours ending 06/27/22 1407 Filed Weights   06/27/22 1232  Weight: (!) 172.4 kg    Examination: General: obese, no acute distress, bipap in place, speaking full sentences HENT: [place]/AT, moist mucous membranes, sclera anicteric Lungs: diffuse scattered wheezing posteriorly Cardiovascular: tachycardic, no murmur Abdomen: soft, non-tender, BS+ Extremities: warm, no edema Neuro: alert, oriented x 3, moving all extremities GU: n/a  Resolved Hospital Problem list     Assessment & Plan:  Asthma Exacerbation Acute Allergic Reaction to Shell Fish  Morbid Obesity Hyperglycemia Tobacco use  Plan - admit to ICU for close airway monitoring given severe shellfish allergy and recent exposure - Continue 60mg  solumedrol IV BID - scheduled Duonebs q4hrs and budesonide nebs BID - PRN albuterol nebs - Continue bipap for now, will try to transition to nasal canula in a couple to few hours - q4 CBGs with steroids and SSI  - hold home metformin - daily pepcid - NPO for now, if improved later on today can resume diet this evening - she is actively working on quitting smoking, down to 1-2 cigarettes per day  Best Practice (right click and "Reselect all SmartList Selections" daily)   Diet/type: NPO DVT prophylaxis: LMWH GI prophylaxis: H2B Lines: N/A Foley:  N/A Code Status:  full code Last date of multidisciplinary goals of care discussion [spoke with patient in ER]  Labs   CBC: Recent Labs  Lab 06/27/22 1240 06/27/22 1248  WBC 11.8*  --   HGB 12.3 12.9  12.6  HCT 37.8 38.0  37.0  MCV 78.6*  --   PLT 460*  --     Basic Metabolic Panel: Recent Labs  Lab 06/27/22 1240 06/27/22 1248  NA 139 138  138  K 4.0 4.1  4.1  CL 106 108  CO2 24  --   GLUCOSE 208* 206*  BUN 6 6  CREATININE 0.63 0.40*  CALCIUM 8.4*  --    GFR: Estimated Creatinine Clearance: 165 mL/min (A) (by C-G  formula based on SCr of 0.4 mg/dL (L)). Recent Labs  Lab 06/27/22 1240  WBC 11.8*    Liver Function Tests: No results for input(s): "AST", "ALT", "ALKPHOS", "BILITOT", "PROT", "ALBUMIN" in the last 168 hours. No results for input(s): "LIPASE", "AMYLASE" in the last 168 hours. No results for input(s): "AMMONIA" in the last 168 hours.  ABG    Component Value Date/Time   HCO3 22.3 06/27/2022 1248   TCO2 23 06/27/2022 1248   TCO2 22 06/27/2022 1248   ACIDBASEDEF 1.0 06/27/2022 1248   O2SAT 98 06/27/2022 1248     Coagulation Profile: No results for input(s): "INR", "PROTIME" in the last 168 hours.  Cardiac Enzymes: No results for input(s): "CKTOTAL", "CKMB", "CKMBINDEX", "TROPONINI" in the last 168 hours.  HbA1C: Hgb A1c MFr Bld  Date/Time Value Ref Range Status  01/24/2019 09:57 AM 6.4 (H) 4.8 - 5.6 % Final    Comment:    (NOTE) Pre diabetes:          5.7%-6.4% Diabetes:              >6.4% Glycemic control for   <7.0% adults with diabetes   07/27/2016 12:22 PM 6.1 (H) 4.8 - 5.6 % Final    Comment:             Pre-diabetes: 5.7 - 6.4          Diabetes: >6.4          Glycemic control for adults with diabetes: <7.0     CBG: No results for input(s): "GLUCAP" in the last 168 hours.  Review of Systems:   Review of Systems  Constitutional:  Negative for chills, fever, malaise/fatigue and weight loss.  HENT:  Negative for congestion, sinus pain and sore throat.   Eyes: Negative.   Respiratory:  Positive for shortness of breath and wheezing. Negative for cough, hemoptysis and sputum production.   Cardiovascular:  Negative for chest pain, palpitations, orthopnea, claudication and leg swelling.  Gastrointestinal:  Negative for abdominal pain, heartburn, nausea and vomiting.  Genitourinary: Negative.   Musculoskeletal:  Negative for joint pain and myalgias.  Skin:  Negative for rash.  Neurological:  Positive for headaches. Negative for weakness.  Endo/Heme/Allergies:  Negative.   Psychiatric/Behavioral: Negative.      Past Medical History:  She,  has a past medical history of Anemia, Asthma, Complication of anesthesia, Dyspnea, Gestational diabetes, Infection, and MRSA infection.   Surgical History:   Past Surgical History:  Procedure Laterality Date   CESAREAN SECTION     CESAREAN SECTION N/A 02/11/2014   CESAREAN SECTION N/A 02/13/2017   Procedure: REPEAT CESAREAN SECTION;  Surgeon: Lazaro Arms, MD;  Location: [facility];  Service: Obstetrics;  Laterality: N/A;   CHOLECYSTECTOMY  01/31/2019   CHOLECYSTECTOMY N/A 01/31/2019   Procedure: Laparoscopic Cholecystectomy;  Surgeon: Griselda Miner, MD;  Location: [facility] OR;  Service: General;  Laterality: N/A;     Social History:   reports that she has quit smoking. Her smoking use included cigarettes. She has never used smokeless tobacco. She reports current alcohol use. She reports that she does not use drugs.   Family History:  Her family history includes Cancer in her maternal grandmother; HIV in her mother; Heart disease in her maternal grandfather.   Allergies Allergies  Allergen Reactions   Shellfish Allergy Anaphylaxis and Hives   Iodine      Home Medications  Prior to Admission medications   Medication Sig Start Date End Date Taking? Authorizing Provider  albuterol (PROVENTIL) (2.5 MG/3ML) 0.083% nebulizer solution Take 3 mLs (2.5 mg total) by nebulization every 6 (six) hours as needed for wheezing or shortness of breath. 11/20/19   Blane Ohara, MD  budesonide-formoterol (SYMBICORT) 80-4.5 MCG/ACT inhaler Inhale 2 puffs into the lungs 2 (two) times daily. Patient not taking: Reported on 04/08/2022    [provider]  cefadroxil (DURICEF) 500 MG capsule Take 1 capsule (500 mg total) by mouth 2 (two) times daily. 04/11/22   Gerrit Heck, CNM  celecoxib (CELEBREX) 50 MG capsule Take 50 mg by mouth 2 (two) times daily.    [provider]  cyclobenzaprine (FLEXERIL)  10 MG tablet Take 10 mg by mouth 3 (three) times daily as needed for muscle spasms.    [provider]  EPINEPHrine 0.3 mg/0.3 mL IJ SOAJ injection Inject 0.3 mg into the muscle as needed for anaphylaxis. 05/18/20   Brantley Fling, MD  escitalopram (LEXAPRO) 5 MG tablet Take 1 tablet by mouth daily. 05/25/21   [provider]  etonogestrel (NEXPLANON) 68 MG IMPL implant 68 mg by Subdermal route once.    [provider]  FLOVENT HFA 110 MCG/ACT inhaler Inhale 1 puff into the lungs 2 (two) times a day.  Patient not taking: Reported on 02/05/2021 11/29/18   [provider]  ipratropium-albuterol (DUONEB) 0.5-2.5 (3) MG/3ML SOLN SMARTSIG:3 Milliliter(s) Via Nebulizer Every 6 Hours Patient not taking: Reported on 02/05/2021 10/13/19   [provider]  ipratropium-albuterol (DUONEB) 0.5-2.5 (3) MG/3ML SOLN Take 3 mLs by nebulization daily for 7 doses. 06/21/22 06/28/22  Gerhard Munch, MD  JANUMET 50-1000 MG tablet Take 1 tablet by mouth 2 (two) times daily. 02/15/22   [provider]  metFORMIN (GLUCOPHAGE) 500 MG tablet Take 500 mg by mouth 2 (two) times daily with a meal. Patient not taking: Reported on 04/08/2022    [provider]  metroNIDAZOLE (FLAGYL) 500 MG tablet Take 1 tablet (500 mg total) by mouth 2 (two) times daily. 04/09/22   Gerrit Heck, CNM  montelukast (SINGULAIR) 10 MG tablet Take 10 mg by mouth at bedtime.     [provider]  phentermine (ADIPEX-P) 37.5 MG tablet Half to one full tablet every morning Patient not taking: Reported on 02/05/2021 01/01/20   [provider]  predniSONE (DELTASONE) 20 MG tablet Take 2 tablets (40 mg total) by mouth daily with breakfast. For the next four days 06/21/22   Gerhard Munch, MD  [facility] HFA 108 [phone] Base) MCG/ACT inhaler Inhale 2 puffs into the lungs every 4 (four) hours as needed for wheezing or shortness of breath. 11/20/19   Blane Ohara, MD  topiramate (TOPAMAX) 25 MG  tablet Take 25 mg by mouth daily. Patient not taking: Reported on 02/05/2021  01/01/20   [provider]     Critical care time: 40 minutes    Melody Comas, MD [place] Office: [phone]   See Amion for personal pager PCCM on call pager [phone] until 7pm. Please call Elink 7p-7a. [phone]

## 2022-06-27 NOTE — Progress Notes (Signed)
Pt was taken off BiPAP and transitioned to [place] 4L with no complications. Pt states her breathing is fine at this time. BiPAP at bedside if needed

## 2022-06-28 LAB — CBC
HCT: 34.5 % — ABNORMAL LOW (ref 36.0–46.0)
Hemoglobin: 11.7 g/dL — ABNORMAL LOW (ref 12.0–15.0)
MCH: 25.5 pg — ABNORMAL LOW (ref 26.0–34.0)
MCHC: 33.9 g/dL (ref 30.0–36.0)
MCV: 75.3 fL — ABNORMAL LOW (ref 80.0–100.0)
Platelets: 354 10*3/uL (ref 150–400)
RBC: 4.58 MIL/uL (ref 3.87–5.11)
RDW: 15.9 % — ABNORMAL HIGH (ref 11.5–15.5)
WBC: 7.8 10*3/uL (ref 4.0–10.5)
nRBC: 0 % (ref 0.0–0.2)

## 2022-06-28 LAB — BASIC METABOLIC PANEL
Anion gap: 7 (ref 5–15)
BUN: 7 mg/dL (ref 6–20)
CO2: 23 mmol/L (ref 22–32)
Calcium: 9 mg/dL (ref 8.9–10.3)
Chloride: 106 mmol/L (ref 98–111)
Creatinine, Ser: 0.57 mg/dL (ref 0.44–1.00)
GFR, Estimated: >60 mL/min (ref >60)
Glucose, Bld: 206 mg/dL — ABNORMAL HIGH (ref 70–99)
Potassium: 3.9 mmol/L (ref 3.5–5.1)
Sodium: 136 mmol/L (ref 135–145)

## 2022-06-28 LAB — MAGNESIUM: Magnesium: 2.2 mg/dL (ref 1.7–2.4)

## 2022-06-28 LAB — PHOSPHORUS
Phosphorus: 1.5 mg/dL — ABNORMAL LOW (ref 2.5–4.6)
Phosphorus: 1.6 mg/dL — ABNORMAL LOW (ref 2.5–4.6)

## 2022-06-28 LAB — GLUCOSE, CAPILLARY
Glucose-Capillary: 193 mg/dL — ABNORMAL HIGH (ref 70–99)
Glucose-Capillary: 200 mg/dL — ABNORMAL HIGH (ref 70–99)
Glucose-Capillary: 266 mg/dL — ABNORMAL HIGH (ref 70–99)
Glucose-Capillary: 271 mg/dL — ABNORMAL HIGH (ref 70–99)

## 2022-06-28 MED ORDER — INSULIN ASPART 100 UNIT/ML IJ SOLN
0.0000 [IU] | INTRAMUSCULAR | Status: DC
  Administered 2022-06-28 (×2): 11 [IU] via SUBCUTANEOUS

## 2022-06-28 MED ORDER — POTASSIUM PHOSPHATES 15 MMOLE/5ML IV SOLN
30.0000 mmol | Freq: Once | INTRAVENOUS | Status: AC
  Administered 2022-06-28: 30 mmol via INTRAVENOUS
  Filled 2022-06-28: qty 10

## 2022-06-28 MED ORDER — METHYLPREDNISOLONE SODIUM SUCC 40 MG IJ SOLR: 40.0000 mg | Freq: Once | INTRAMUSCULAR | Status: DC

## 2022-06-28 MED ORDER — PREDNISONE 20 MG PO TABS
40.0000 mg | ORAL_TABLET | Freq: Every day | ORAL | Status: AC
  Administered 2022-06-28: 40 mg via ORAL
  Filled 2022-06-28: qty 2

## 2022-06-28 MED ORDER — PREDNISONE 10 MG PO TABS: ORAL_TABLET | ORAL | 0 refills | Status: AC

## 2022-06-28 MED ORDER — IPRATROPIUM-ALBUTEROL 0.5-2.5 (3) MG/3ML IN SOLN
3.0000 mL | RESPIRATORY_TRACT | Status: DC | PRN
  Administered 2022-06-28: 3 mL via RESPIRATORY_TRACT
  Filled 2022-06-28: qty 3

## 2022-06-28 NOTE — Discharge Summary (Signed)
Physician Discharge Summary         Patient ID: Tina Grant MRN: [id number] DOB/AGE: 33/18/1992 33 y.o.  Admit date: 06/27/2022 Discharge date: 06/28/2022  Discharge Diagnoses:   Acute Allergic reaction to shell Fish in setting of shell fish allergy Asthma exacerbation triggered by acute shell fish allergy Active Hospital Problems   Diagnosis Date Noted   Acute allergic reaction 06/27/2022    Resolved Hospital Problems  No resolved problems to display.      Discharge summary   Tina Grant is a 31 year old female with history of asthma, anxiety and shell fish allergy who presented to the ED 06/27/2022 with acute allergic reaction after cleaning a pot that had been used for shell fish. She has a shell fish allergy.  She developed progressive shortness of breath and wheezing. She called EMS. En Route she was given 0.6 mg of epi IM. 10 of albuterol. 1 of atrovent. 125 solu-medrol. 2g of magnesium and benadryl.    In the ED she was still working hard to breath and was given additional epi, albuterol and Pepcid.  She was placed on BiPAP with relief in her work of breathing.   She was in the ED 06/21/2022 for shortness of breath and treated for asthma exacerbation. She never picked up her prescription for prednisone and never felt like her breathing got back to baseline before today's symptoms. She does not reliably use her maintenance inhalers. She has not been using her Singulair.   Pt was admitted over night 12/24-12/25 . She was placed on BiPAP in the ER to support her. In hospital she was treated with Solumedrol, Pepcid, Singulair , oxygen and Pulmicort and Duonebs. The morning of 06/28/2022 she is on RA and she has rare wheeze upon auscultation. Saturations are 95%. She states her chest tightness is much better but not entirely gone. She wants to go home as it is Christmas Day. Given her history of non-compliance, we will give  her the first dose of her prednisone taper and a DuoNeb   prior to discharge with instruction to pick up the RX on the way home and start second dose morning of 06/29/2022.She will also be sent home with DuoNebs to use as needed. She has been instructed to restart her Singulair 10 mg daily and to use her Advair inhaler 2 puffs in the morning and evening without fail, whether she feels good or bad. We discussed return to hospital indications and seek emergency care indications of which she verbalized understanding.      Discharge Plan by Active Problems    Asthma Exacerbation Acute Allergic Reaction to Shell Fish  Morbid Obesity Hyperglycemia Tobacco use   Plan - Discharge home from ICU - Resume Advair 2 puffs in the morning and 2 puffs in the evening This is your maintenance medication and must be taken daily without fail, feeling good as well as bad.  - Rinse mouth after use  - Duonebs q4hrs as needed for shortness of breath or wheezing - albuterol nebs as needed for shortness sof breath or wheezing  - Resume home metformin - Take Pepcid 20 mg daily x 7 days ( generic is ok   - Regular Diet, watch  - Keep working on  quitting smoking. Smoking is terrible for your asthma.  - Call 1-800-QUIT NOW for free nicotine replacement therapy. - Consider using nicotine gum or mints when you crave a cigarette.      Significant Hospital tests/ studies  CXR 06/27/2022  No acute cardiopulmonary abnormality. 06/27/22 BiPAP Therapy x 12 hours   Procedures   None Culture data/antimicrobials   None   Consults  PCCM     Discharge Exam: BP 111/70 (BP Location: Right Wrist)   Pulse 100   Temp 98.5 F (36.9 C) (Oral)   Resp 16   Ht 5\' 6"  (1.676 m)   Wt (!) 172.4 kg   SpO2 96%   BMI 61.33 kg/m   Examination  General: obese, no acute distress, on RA , sats 95%, in no distress HENT: [place]/AT, moist mucous membranes, sclera anicteric Lungs: Bilateral chest excursion , Clear with few light wheezes per right upper lobe, diminished per  bases Cardiovascular: tachycardic, no murmur. Rub or gallop Abdomen: soft, non-tender, BS+, Body mass index is 61.33 kg/m.  Extremities: warm, no edema Neuro: alert, oriented x 3, moving all extremities GU: n/a    Labs at discharge   Lab Results  Component Value Date   CREATININE 0.57 06/28/2022   BUN 7 06/28/2022   NA 136 06/28/2022   K 3.9 06/28/2022   CL 106 06/28/2022   CO2 23 06/28/2022   Lab Results  Component Value Date   WBC 7.8 06/28/2022   HGB 11.7 (L) 06/28/2022   HCT 34.5 (L) 06/28/2022   MCV 75.3 (L) 06/28/2022   PLT 354 06/28/2022   Lab Results  Component Value Date   ALT 13 11/06/2020   AST 10 (L) 11/06/2020   ALKPHOS 69 11/06/2020   BILITOT 0.2 (L) 11/06/2020   No results found for: "INR", "PROTIME"  Current radiological studies    DG Chest Portable 1 View  Result Date: 06/27/2022 CLINICAL DATA:  SOB EXAM: PORTABLE CHEST 1 VIEW COMPARISON:  June 21, 2022 FINDINGS: The cardiomediastinal silhouette is unchanged in contour. No pleural effusion. No pneumothorax. No acute pleuroparenchymal abnormality. IMPRESSION: No acute cardiopulmonary abnormality. Electronically Signed   By: June 23, 2022 M.D.   On: 06/27/2022 13:08    Disposition:  Home with prednisone taper, Duonebs, Singulair, Albuterol prn, and pepcid 20 mg daily x 1 week . Follow up with [facility] within a week of discharge      Allergies as of 06/28/2022       Reactions   Shellfish Allergy Anaphylaxis, Hives   Iodine         Medication List     TAKE these medications    albuterol (2.5 MG/3ML) 0.083% nebulizer solution Commonly known as: PROVENTIL Take 3 mLs (2.5 mg total) by nebulization every 6 (six) hours as needed for wheezing or shortness of breath.   ProAir HFA 108 (90 Base) MCG/ACT inhaler Generic drug: albuterol Inhale 2 puffs into the lungs every 4 (four) hours as needed for wheezing or shortness of breath.   celecoxib 100 MG  capsule Commonly known as: CELEBREX Take 100 mg by mouth 2 (two) times daily as needed for pain.   cyclobenzaprine 10 MG tablet Commonly known as: FLEXERIL Take 10 mg by mouth 3 (three) times daily as needed for muscle spasms.   EPINEPHrine 0.3 mg/0.3 mL Soaj injection Commonly known as: EPI-PEN Inject 0.3 mg into the muscle as needed for anaphylaxis.   escitalopram 5 MG tablet Commonly known as: LEXAPRO Take 1 tablet by mouth daily.   etonogestrel 68 MG Impl implant Commonly known as: NEXPLANON 68 mg by Subdermal route once.   fluticasone-salmeterol 250-50 MCG/ACT Aepb Commonly known as: ADVAIR Inhale 1 puff into the lungs 2 (two) times daily.  ipratropium-albuterol 0.5-2.5 (3) MG/3ML Soln Commonly known as: DUONEB Take 3 mLs by nebulization daily for 7 doses.   Janumet 50-1000 MG tablet Generic drug: sitaGLIPtin-metformin Take 1 tablet by mouth 2 (two) times daily.   montelukast 10 MG tablet Commonly known as: SINGULAIR Take 10 mg by mouth at bedtime.   predniSONE 10 MG tablet Commonly known as: DELTASONE Prednisone taper; 10 mg tablets: 4 tabs x 3 days, 3 tabs x 3 days, 2 tabs x 3 days 1 tab x 3 days then stop. What changed:  medication strength how much to take how to take this when to take this additional instructions   Vitamin D (Ergocalciferol) 1.25 MG (50000 UNIT) Caps capsule Commonly known as: DRISDOL Take 50,000 Units by mouth once a week. Wednesday         Follow-up appointment    Follow up with PCP within 1 week of discharge Discharge Condition:    good  Physician Statement:   The Patient was personally examined, the discharge assessment and plan has been personally reviewed and I agree with ACNP  Terell Kincy's assessment and plan. 40 minutes of time have been dedicated to discharge assessment, planning and discharge instructions.   Signed: Bevelyn Ngo, MSN, AGACNP-BC [facility] Medicine See Amion for personal  pager PCCM on call pager [phone]  06/28/2022, 11:50 AM

## 2022-06-28 NOTE — Progress Notes (Signed)
Pharmacy Electrolyte Replacement  Recent Labs:  Recent Labs    06/28/22 0305 06/28/22 0453  K 3.9  --   MG 2.2  --   PHOS 1.5* 1.6*  CREATININE 0.57  --     Low Values Present: Phos 1.6  Plan:   Kphos 30 mmol IV x 1  Jeanella Cara, PharmD, [place] Clinical Pharmacist Please see AMION for all Pharmacists' Contact Phone Numbers 06/28/2022, 9:18 AM

## 2022-06-28 NOTE — Progress Notes (Signed)
eLink Physician-Brief Progress Note Patient Name: KASY IANNACONE DOB: 1991-07-02 MRN: [id number]   Date of Service  06/28/2022  HPI/Events of Note  Phosphorus dropped from > 3 to 1.5   eICU Interventions  Re check again and replace if low.      Intervention Category Intermediate Interventions: Electrolyte abnormality - evaluation and management  Ranee Gosselin 06/28/2022, 4:20 AM

## 2022-06-28 NOTE — TOC Transition Note (Signed)
Transition of Care [facility]) - CM/SW Discharge Note   Patient Details  Name: LUKA REISCH MRN: [id number] Date of Birth: Apr 12, 1991  [facility]) CM/SW Contact:  Tom-Johnson, Hershal Coria, RN Phone Number: 06/28/2022, 3:28 PM   Clinical Narrative:     Patient is scheduled for discharge today. No TOC needs or recommendations noted. Family to transport at discharge. No further TOC needs noted.      Final next level of care: Home/Self Care Barriers to Discharge: Barriers Resolved   Patient Goals and CMS Choice CMS Medicare.gov Compare Post Acute Care list provided to:: Patient Choice offered to / list presented to : NA  Discharge Placement                  Patient to be transferred to facility by: Family      Discharge Plan and Services Additional resources added to the After Visit Summary for                  DME Arranged: N/A DME Agency: NA       HH Arranged: NA HH Agency: NA        Social Determinants of Health (SDOH) Interventions SDOH Screenings   Tobacco Use: High Risk (06/27/2022)     Readmission Risk Interventions     No data to display

## 2022-06-28 NOTE — Discharge Instructions (Signed)
-   Resume Advair 2 puffs in the morning and 2 puffs in the evening This is your maintenance medication and must be taken daily without fail, feeling good as well as bad.  - Rinse mouth after use  - Duonebs q4hrs as needed for shortness of breath or wheezing - albuterol nebs as needed for shortness sof breath or wheezing  - Resume home metformin - Take Pepcid 20 mg daily x 7 days ( generic is ok   - Regular Diet, watch  - Keep working on  quitting smoking. Smoking is terrible for your asthma.  - Call 1-800-QUIT NOW for free nicotine replacement therapy. - Consider using nicotine gum or mints when you crave a cigarette.  - If you develop shortness of breath that does not resolve, seek emergency care

## 2022-06-29 LAB — HEMOGLOBIN A1C
Hgb A1c MFr Bld: 7.4 % — ABNORMAL HIGH (ref 4.8–5.6)
Mean Plasma Glucose: 166 mg/dL

## 2023-03-21 ENCOUNTER — Ambulatory Visit: Payer: Medicaid Other | Admitting: Obstetrics & Gynecology

## 2023-12-05 ENCOUNTER — Ambulatory Visit: Admitting: Obstetrics & Gynecology

## 2023-12-21 ENCOUNTER — Encounter: Payer: Self-pay | Admitting: Obstetrics and Gynecology

## 2023-12-21 ENCOUNTER — Ambulatory Visit (INDEPENDENT_AMBULATORY_CARE_PROVIDER_SITE_OTHER): Admitting: Obstetrics and Gynecology

## 2023-12-21 VITALS — BP 126/92 | HR 110 | Ht 66.0 in | Wt >= 6400 oz

## 2023-12-21 DIAGNOSIS — Z30011 Encounter for initial prescription of contraceptive pills: Secondary | ICD-10-CM

## 2023-12-21 DIAGNOSIS — Z3046 Encounter for surveillance of implantable subdermal contraceptive: Secondary | ICD-10-CM

## 2023-12-21 MED ORDER — SLYND 4 MG PO TABS: 1.0000 | ORAL_TABLET | Freq: Every day | ORAL | 3 refills | Status: DC

## 2023-12-21 MED ORDER — SLYND 4 MG PO TABS: 1.0000 | ORAL_TABLET | Freq: Every day | ORAL | 3 refills | Status: AC

## 2023-12-21 NOTE — Progress Notes (Signed)
 Getting nexplanon  out. Would like to discuss other forms of birth control with the provider.  No other concerns at this time.

## 2023-12-21 NOTE — Progress Notes (Signed)
     GYNECOLOGY OFFICE PROCEDURE NOTE  Tina Grant is a 33 y.o. G3P1003 here for Nexplanon  removal Last pap smear was on 2021 and was normal.  No other gynecologic concerns.  Nexplanon  Removal Patient identified, informed consent performed, consent signed.   Appropriate time out taken.   Nexplanon  site identified.  Area prepped in usual sterile fashon. One ml of 1% lidocaine  was used to anesthetize the area at the distal end of the implant. A small stab incision was made right beside the implant on the distal portion.  The Nexplanon  rod was grasped using hemostats and removed without difficulty.  There was minimal blood loss. There were no complications.  3 ml of 1% lidocaine  was injected around the incision for post-procedure analgesia.  Steri-strips were applied over the small incision.  A pressure bandage was applied to reduce any bruising.    The patient tolerated the procedure well and was given post procedure instructions.  Patient is planning to use POPs for contraception. CI to OCP, will send POP  Return in 4 months for annual   Susi Eric, FNP [facility] [employer], [facility]
